# Patient Record
Sex: Male | Born: 1970 | Race: White | Hispanic: No | Marital: Married | Smoking: Never smoker
Health system: Southern US, Community
[De-identification: ages and names within clinical notes are randomized; demographics above are authoritative.]

## PROBLEM LIST (undated history)

## (undated) DIAGNOSIS — I341 Nonrheumatic mitral (valve) prolapse: Secondary | ICD-10-CM

## (undated) DIAGNOSIS — E785 Hyperlipidemia, unspecified: Secondary | ICD-10-CM

## (undated) DIAGNOSIS — N200 Calculus of kidney: Secondary | ICD-10-CM

## (undated) HISTORY — PX: OTHER SURGICAL HISTORY: SHX169

## (undated) HISTORY — DX: Hyperlipidemia, unspecified: E78.5

## (undated) HISTORY — DX: Calculus of kidney: N20.0

## (undated) HISTORY — DX: Nonrheumatic mitral (valve) prolapse: I34.1

## (undated) HISTORY — PX: WISDOM TOOTH EXTRACTION: SHX21

---

## 2002-08-01 ENCOUNTER — Encounter: Payer: Self-pay | Admitting: Internal Medicine

## 2002-08-01 ENCOUNTER — Encounter: Admission: RE | Admit: 2002-08-01 | Discharge: 2002-08-01 | Payer: Self-pay | Admitting: Internal Medicine

## 2005-05-03 ENCOUNTER — Ambulatory Visit: Payer: Self-pay | Admitting: Internal Medicine

## 2006-05-11 ENCOUNTER — Ambulatory Visit: Payer: Self-pay | Admitting: Internal Medicine

## 2008-03-16 ENCOUNTER — Telehealth (INDEPENDENT_AMBULATORY_CARE_PROVIDER_SITE_OTHER): Payer: Self-pay | Admitting: *Deleted

## 2008-03-16 ENCOUNTER — Emergency Department (HOSPITAL_COMMUNITY): Admission: EM | Admit: 2008-03-16 | Discharge: 2008-03-16 | Payer: Self-pay | Admitting: Emergency Medicine

## 2008-08-07 ENCOUNTER — Ambulatory Visit: Payer: Self-pay | Admitting: Internal Medicine

## 2008-08-07 DIAGNOSIS — I059 Rheumatic mitral valve disease, unspecified: Secondary | ICD-10-CM | POA: Insufficient documentation

## 2008-08-21 ENCOUNTER — Encounter (INDEPENDENT_AMBULATORY_CARE_PROVIDER_SITE_OTHER): Payer: Self-pay | Admitting: *Deleted

## 2010-12-11 LAB — CONVERTED CEMR LAB
Albumin: 4.3 g/dL (ref 3.5–5.2)
Alkaline Phosphatase: 29 units/L — ABNORMAL LOW (ref 39–117)
BUN: 16 mg/dL (ref 6–23)
Calcium: 9 mg/dL (ref 8.4–10.5)
Creatinine, Ser: 1.1 mg/dL (ref 0.4–1.5)
Eosinophils Absolute: 0.1 10*3/uL (ref 0.0–0.7)
Eosinophils Relative: 1.8 % (ref 0.0–5.0)
GFR calc Af Amer: 98 mL/min
GFR calc non Af Amer: 81 mL/min
Glucose, Bld: 94 mg/dL (ref 70–99)
HCT: 44.9 % (ref 39.0–52.0)
HDL: 37.7 mg/dL — ABNORMAL LOW (ref >39.0)
Hemoglobin: 16.1 g/dL (ref 13.0–17.0)
MCV: 86.4 fL (ref 78.0–100.0)
Monocytes Absolute: 0.5 10*3/uL (ref 0.1–1.0)
Monocytes Relative: 8.4 % (ref 3.0–12.0)
Neutro Abs: 3.4 10*3/uL (ref 1.4–7.7)
Platelets: 251 10*3/uL (ref 150–400)
Potassium: 4.2 meq/L (ref 3.5–5.1)
RDW: 12 % (ref 11.5–14.6)
TSH: 0.91 microintl units/mL (ref 0.35–5.50)
Total Protein: 7.6 g/dL (ref 6.0–8.3)
Triglycerides: 90 mg/dL (ref 0–149)
WBC: 5.6 10*3/uL (ref 4.5–10.5)

## 2011-04-28 ENCOUNTER — Encounter: Payer: Self-pay | Admitting: Internal Medicine

## 2011-05-04 ENCOUNTER — Encounter: Payer: Self-pay | Admitting: Internal Medicine

## 2011-05-05 ENCOUNTER — Encounter: Payer: Self-pay | Admitting: Internal Medicine

## 2011-05-05 ENCOUNTER — Ambulatory Visit (INDEPENDENT_AMBULATORY_CARE_PROVIDER_SITE_OTHER): Payer: BC Managed Care – PPO | Admitting: Internal Medicine

## 2011-05-05 VITALS — BP 112/68 | HR 64 | Temp 98.4°F | Resp 12 | Ht 74.75 in | Wt 164.0 lb

## 2011-05-05 DIAGNOSIS — R3129 Other microscopic hematuria: Secondary | ICD-10-CM | POA: Insufficient documentation

## 2011-05-05 DIAGNOSIS — I059 Rheumatic mitral valve disease, unspecified: Secondary | ICD-10-CM

## 2011-05-05 DIAGNOSIS — Z0000 Encounter for general adult medical examination without abnormal findings: Secondary | ICD-10-CM

## 2011-05-05 NOTE — Progress Notes (Signed)
Addended by: Legrand Como on: 05/05/2011 12:00 PM   Modules accepted: Orders

## 2011-05-05 NOTE — Progress Notes (Signed)
Subjective:    Patient ID: Michael Sampson E Wetherbee, male    DOB: 01/13/1971, 40 y.o.   MRN: [id number]  HPI  Michael Sampson  is here for a physical; he has no acute issues.      Review of Systems Patient reports no significant    vision/ hearing changes,anorexia, weight change, fever ,adenopathy, persistant / recurrent hoarseness, swallowing issues, chest pain,palpitations, edema,persistant / recurrent cough, hemoptysis, dyspnea(rest, exertional, paroxysmal nocturnal), gastrointestinal  bleeding (melena, rectal bleeding), abdominal pain, excessive heart burn, GU symptoms( dysuria, hematuria, pyuria, voiding  issues) syncope, focal weakness, memory loss,numbness & tingling, skin/hair/nail changes,depression, anxiety, abnormal bruising/bleeding, musculoskeletal symptoms/signs.      Objective:   Physical Exam Gen.: Thin but healthy and well-nourished in appearance. Alert, appropriate and cooperative throughout exam. Head: Normocephalic without obvious abnormalities;  no alopecia . Moustache Eyes: No corneal or conjunctival inflammation noted. Pupils equal round reactive to light and accommodation. Fundal exam is benign without hemorrhages, exudate, papilledema. Extraocular motion intact. Vision grossly normal. Ears: External  ear exam reveals no significant lesions or deformities. Canals clear .TMs normal. Hearing is grossly normal bilaterally. Nose: External nasal exam reveals no deformity or inflammation. Nasal mucosa are pink and moist. No lesions or exudates noted.  Mouth: Oral mucosa and oropharynx reveal no lesions or exudates. Teeth in good repair. Neck: No deformities, masses, or tenderness noted. Range of motion &. Thyroid normal. Lungs: Normal respiratory effort; chest expands symmetrically. Lungs are clear to auscultation without rales, wheezes, or increased work of breathing. Heart: Normal rate and rhythm. Normal S1 and S2. No gallop,  or rub. Classic click @ apex ;  No MR murmur . Abdomen: Bowel  sounds normal; abdomen soft and nontender. No masses, organomegaly or hernias noted. Genitalia/ DRE: L varicocele; prostate ULN                                                                                      Musculoskeletal/extremities: No deformity or scoliosis noted of  the thoracic or lumbar spine. No clubbing, cyanosis, edema, or deformity noted. Range of motion  normal .Tone & strength  normal.Joints normal. Nail health  Good. Mild crepitus of knees Vascular: Carotid, radial artery, dorsalis pedis and dorsalis posterior tibial pulses are full and equal. No bruits present. Neurologic: Alert and oriented x3. Deep tendon reflexes symmetrical and normal.          Skin: Intact without suspicious lesions or rashes.Op scar anterior chest. Lymph: No cervical, axillary, or inguinal lymphadenopathy present. Psych: Mood and affect are normal. Normally interactive                                                                                         Assessment & Plan:  #1 comprehensive physical exam; no acute findings #2 see Problem List with Assessments & Recommendations Plan:  see Orders

## 2011-05-05 NOTE — Patient Instructions (Signed)
Preventive Health Care: Exercise at least 30-45 minutes a day,  3-4 days a week.  Eat a low-fat diet with lots of fruits and vegetables, up to 7-9 servings per day.

## 2011-07-08 ENCOUNTER — Encounter: Payer: Self-pay | Admitting: Internal Medicine

## 2011-07-08 DIAGNOSIS — E785 Hyperlipidemia, unspecified: Secondary | ICD-10-CM | POA: Insufficient documentation

## 2011-07-18 ENCOUNTER — Encounter: Payer: Self-pay | Admitting: Internal Medicine

## 2012-05-10 ENCOUNTER — Ambulatory Visit (INDEPENDENT_AMBULATORY_CARE_PROVIDER_SITE_OTHER): Payer: BC Managed Care – PPO | Admitting: Internal Medicine

## 2012-05-10 ENCOUNTER — Encounter: Payer: Self-pay | Admitting: Internal Medicine

## 2012-05-10 VITALS — BP 122/80 | HR 65 | Temp 98.3°F | Resp 12 | Ht 74.0 in | Wt 175.6 lb

## 2012-05-10 DIAGNOSIS — Z0000 Encounter for general adult medical examination without abnormal findings: Secondary | ICD-10-CM

## 2012-05-10 DIAGNOSIS — E785 Hyperlipidemia, unspecified: Secondary | ICD-10-CM

## 2012-05-10 DIAGNOSIS — I059 Rheumatic mitral valve disease, unspecified: Secondary | ICD-10-CM

## 2012-05-10 LAB — CBC WITH DIFFERENTIAL/PLATELET
Eosinophils Relative: 5.2 % — ABNORMAL HIGH (ref 0.0–5.0)
Lymphocytes Relative: 32.9 % (ref 12.0–46.0)
Lymphs Abs: 2 10*3/uL (ref 0.7–4.0)
MCHC: 33.5 g/dL (ref 30.0–36.0)
Monocytes Absolute: 0.4 10*3/uL (ref 0.1–1.0)
Neutro Abs: 3.3 10*3/uL (ref 1.4–7.7)
Neutrophils Relative %: 54.6 % (ref 43.0–77.0)
RBC: 5.47 Mil/uL (ref 4.22–5.81)
RDW: 13.8 % (ref 11.5–14.6)

## 2012-05-10 LAB — LIPID PANEL
Cholesterol: 184 mg/dL (ref 0–200)
LDL Cholesterol: 120 mg/dL — ABNORMAL HIGH (ref 0–99)
Triglycerides: 80 mg/dL (ref 0.0–149.0)

## 2012-05-10 LAB — T4, FREE: Free T4: 0.95 ng/dL (ref 0.60–1.60)

## 2012-05-10 LAB — HEPATIC FUNCTION PANEL
ALT: 19 U/L (ref 0–53)
AST: 14 U/L (ref 0–37)
Albumin: 4.2 g/dL (ref 3.5–5.2)
Total Protein: 7.4 g/dL (ref 6.0–8.3)

## 2012-05-10 LAB — BASIC METABOLIC PANEL
CO2: 30 mEq/L (ref 19–32)
Calcium: 9.1 mg/dL (ref 8.4–10.5)
Creatinine, Ser: 1.1 mg/dL (ref 0.4–1.5)

## 2012-05-10 NOTE — Progress Notes (Signed)
Subjective:    Patient ID: Michael Sampson, male    DOB: 1971/10/18, 41 y.o.   MRN: [id number]  HPI  [name] is here for a physical;acute issues include intermittent fatigue X 9 months      Review of Systems  Fatigue only  with exertion   Primarily physical fatigue Symptoms: Fever/ chills : no Night sweats:no                                                                                            Vision changes ( blurred/ double/ loss): no                                                                                                Hoarseness or swallowing dysfunction: no                                                                                        Bowel changes( constipation/ diarrhea): no                                                                                     Weight change: no   Exertional chest pain:no  Dyspnea on exertion: no  Cough: dry cough Hemoptysis: no  New medications:no Leg swelling: no Orthopnea: no PND: no  Melena/ rectal bleeding:no Adenopathy: no Severe snoring: no Daytime sleepiness: no Skin / hair / nail changes: no  Temperature intolerance( heat/ cold) :no                                                                                                   Feeling depressed: no  Anhedonia: no Altered appetite: no Poor  sleep/ Apnea :no Abnormal bruising / bleeding or enlarged lymph nodes: no                                                                        PMH/ FH of thyroid disease: sister & mother      Objective:   Physical Exam Gen.: Thin but healthy and well-nourished in appearance. Alert, appropriate and cooperative throughout exam. Head: Normocephalic without obvious abnormalities;  no alopecia . Unshaven; moustache Eyes: No corneal or conjunctival inflammation noted. Pupils equal round reactive to light and accommodation. Fundal exam is benign without hemorrhages, exudate, papilledema. Extraocular motion intact. Vision  grossly normal. Ears: External  ear exam reveals no significant lesions or deformities. Canals clear .TMs normal. Hearing is grossly normal bilaterally. Nose: External nasal exam reveals no deformity or inflammation. Nasal mucosa are pink and moist. No lesions or exudates noted.   Mouth: Oral mucosa and oropharynx reveal no lesions or exudates. Teeth in good repair. Neck: No deformities, masses, or tenderness noted. Range of motion & Thyroid normal  Lungs: Normal respiratory effort; chest expands symmetrically. Lungs are clear to auscultation without rales, wheezes, or increased work of breathing. Heart: Normal rate and rhythm. Normal S1 and S2. No gallop,  or rub. Apical click intermittently w/o murmur. Abdomen: Bowel sounds normal; abdomen soft and nontender. No masses, organomegaly or hernias noted. Genitalia/ DRE: Genitalia normal except for left varices .Prostate is normal without enlargement, asymmetry, nodularity, or induration.  Musculoskeletal/extremities: No deformity or scoliosis noted of  the thoracic or lumbar spine. No clubbing, cyanosis, edema, or deformity noted. Range of motion  normal .Tone & strength  normal.Joints normal. Nail health  good. Vascular: Carotid, radial artery, dorsalis pedis and  posterior tibial pulses are full and equal. No bruits present. Neurologic: Alert and oriented x3. Deep tendon reflexes symmetrical and normal.          Skin: Intact without suspicious lesions or rashes. Lymph: No cervical, axillary, or inguinal lymphadenopathy present. Psych: Mood and affect are normal. Normally interactive                                                                                         Assessment & Plan:  #1 comprehensive physical exam; no acute findings #2 see Problem List with Assessments & Recommendations #3 fatigue Plan: see Orders

## 2012-05-10 NOTE — Patient Instructions (Addendum)
Preventive Health Care: Exercise at least 30-45 minutes a day,  3-4 days a week.  Eat a low-fat diet with lots of fruits and vegetables, up to 7-9 servings per day. Consume less than 40 grams of sugar per day from foods & drinks with High Fructose Corn Sugar as # 1,2,3 or # 4 on label. Health Care Power of Attorney & Living Will. Complete if not in place ; these place you in charge of your health care decisions. Please try to go on My Chart within the next 24 hours to allow me to release the results directly to you.  

## 2012-07-18 ENCOUNTER — Encounter: Payer: Self-pay | Admitting: Internal Medicine

## 2012-07-18 ENCOUNTER — Ambulatory Visit (INDEPENDENT_AMBULATORY_CARE_PROVIDER_SITE_OTHER): Payer: BC Managed Care – PPO | Admitting: Internal Medicine

## 2012-07-18 VITALS — BP 118/82 | HR 69 | Temp 97.6°F | Wt 182.8 lb

## 2012-07-18 DIAGNOSIS — J209 Acute bronchitis, unspecified: Secondary | ICD-10-CM

## 2012-07-18 MED ORDER — AZITHROMYCIN 250 MG PO TABS: ORAL_TABLET | ORAL | Status: AC

## 2012-07-18 NOTE — Progress Notes (Signed)
  Subjective:    Patient ID: Michael Sampson, male    DOB: July 06, 1971, 41 y.o.   MRN: [id number]  HPI He began to have a dry cough 2-3 weeks ago after exposure to dust. The cough has persisted but is intermittent. Has been associated with some shortness of breath but no wheezing. He describes some pressure sensation with the chest symptoms.  He's also had sneezing and watery eyes for which he took Benadryl with some response.    Review of Systems He denies significant or new nasal congestion; nasal purulence; facial pain; anosmia; fatigue; fever; headache; halitosis; earache and dental pain.  He does have some chronic, intermittent left ear discomfort unrelated to the present illness. There is some decrease in obstruction to nasal airflow on the right.  He describes intermittent cramping abdominal discomfort on either lateral side and some loose stools. He denies melena, rectal bleeding, or frank diarrhea. He has no hematuria, dysuria,or pyuria     Objective:   Physical Exam General appearance: thin but in good health ;well nourished; no acute distress or increased work of breathing is present.  No  lymphadenopathy about the head, neck, or axilla noted.   Eyes: No conjunctival inflammation or lid edema is present.   Ears:  External ear exam shows no significant lesions or deformities.  Otoscopic examination reveals clear canals, tympanic membranes are intact bilaterally without bulging, retraction, inflammation or discharge.  Nose:  External nasal examination shows no deformity or inflammation. R nasal mucosa are dry & erythematous without lesions or exudates. R septal  Deviation.Some obstruction to airflow on R.   Oral exam: Dental hygiene is good; lips and gums are healthy appearing.There is no oropharyngeal erythema or exudate noted.     Heart:  Normal rate and regular rhythm. S1 and S2 normal without gallop, murmur, click, rub or other extra sounds.   Lungs:Chest clear to  auscultation; no wheezes, rhonchi,rales ,or rubs present.No increased work of breathing.    Bowel sounds are normal. Abdomen is soft with minimal L suprapubic tenderness; no organomegaly, hernias  or masses.   Extremities:  No cyanosis, edema, or clubbing  noted    Skin: Warm & dry           Assessment & Plan:  #1 acute bronchitis w/o bronchospasm. No URI #2 IBS Plan: See orders and recommendations

## 2012-07-18 NOTE — Patient Instructions (Addendum)
Plain Mucinex for thick secretions ;force NON dairy fluids . Use a Neti pot daily as needed for sinus congestion; going from open side to congested side . Nasal cleansing in the shower as discussed. Make sure that all residual soap is removed to prevent irritation.Nasonex 1 spray in each nostril twice a day as needed. Use the "crossover" technique as discussed. Plain Allegra 160 daily as needed for itchy eyes & sneezing. Please take the probiotic , Align, every day until the bowels are normal. This will replace the normal bacteria which  are necessary for formation of normal stool and processing of food.

## 2012-12-28 ENCOUNTER — Other Ambulatory Visit: Payer: Self-pay

## 2013-01-15 ENCOUNTER — Other Ambulatory Visit: Payer: Self-pay | Admitting: Occupational Medicine

## 2013-01-15 ENCOUNTER — Ambulatory Visit: Payer: Self-pay

## 2013-01-15 DIAGNOSIS — M79672 Pain in left foot: Secondary | ICD-10-CM

## 2013-05-23 ENCOUNTER — Ambulatory Visit (INDEPENDENT_AMBULATORY_CARE_PROVIDER_SITE_OTHER): Payer: BC Managed Care – PPO | Admitting: Internal Medicine

## 2013-05-23 ENCOUNTER — Encounter: Payer: Self-pay | Admitting: Internal Medicine

## 2013-05-23 VITALS — BP 116/70 | HR 66 | Temp 97.7°F | Resp 12 | Ht 74.08 in | Wt 182.0 lb

## 2013-05-23 DIAGNOSIS — Z0000 Encounter for general adult medical examination without abnormal findings: Secondary | ICD-10-CM

## 2013-05-23 LAB — CBC WITH DIFFERENTIAL/PLATELET
Basophils Absolute: 0 10*3/uL (ref 0.0–0.1)
HCT: 44.6 % (ref 39.0–52.0)
MCHC: 33.8 g/dL (ref 30.0–36.0)
MCV: 86.4 fl (ref 78.0–100.0)
Monocytes Relative: 7.1 % (ref 3.0–12.0)
Neutro Abs: 2.9 10*3/uL (ref 1.4–7.7)
Neutrophils Relative %: 54.4 % (ref 43.0–77.0)
Platelets: 240 10*3/uL (ref 150.0–400.0)
WBC: 5.3 10*3/uL (ref 4.5–10.5)

## 2013-05-23 LAB — BASIC METABOLIC PANEL
BUN: 19 mg/dL (ref 6–23)
CO2: 28 mEq/L (ref 19–32)
Chloride: 107 mEq/L (ref 96–112)
Creatinine, Ser: 1 mg/dL (ref 0.4–1.5)
Potassium: 4 mEq/L (ref 3.5–5.1)

## 2013-05-23 LAB — HEPATIC FUNCTION PANEL
AST: 13 U/L (ref 0–37)
Albumin: 4 g/dL (ref 3.5–5.2)
Alkaline Phosphatase: 33 U/L — ABNORMAL LOW (ref 39–117)

## 2013-05-23 LAB — LIPID PANEL
LDL Cholesterol: 109 mg/dL — ABNORMAL HIGH (ref 0–99)
Total CHOL/HDL Ratio: 4
VLDL: 12.2 mg/dL (ref 0.0–40.0)

## 2013-05-23 LAB — TSH: TSH: 1.07 u[IU]/mL (ref 0.35–5.50)

## 2013-05-23 NOTE — Progress Notes (Signed)
Subjective:    Patient ID: Michael Sampson, male    DOB: [birth year], 42 y.o.   MRN: [id number]  HPI  He is here for a physical;acute issues denied     Review of Systems He is on no specific diet; he is physically active on his job without symptoms. Specifically he denies chest pain, palpitations, dyspnea, or claudication. Family history is negative for premature coronary disease. His LDL goal has been mildly elevated.  In April he sustained an injury to the left foot at work. Films were negative. After a day of being physically active on his feet he will have pain in the mid, medial aspect of the left plantar fascia.     Objective:   Physical Exam  Gen.: Healthy and well-nourished in appearance. Alert, appropriate and cooperative throughout exam. Appears younger than stated age  Head: Normocephalic without obvious abnormalities; moustache Eyes: No corneal or conjunctival inflammation noted. Pupils equal round reactive to light and accommodation. Slight ptosis OS . Extraocular motion intact. Vision grossly normal without lenses Ears: External  ear exam reveals no significant lesions or deformities. Canals clear .TMs normal. Hearing is grossly normal bilaterally. Nose: External nasal exam reveals no deformity or inflammation. Nasal mucosa are pink and moist. No lesions or exudates noted.  Mouth: Oral mucosa and oropharynx reveal no lesions or exudates. Teeth in good repair. Neck: No deformities, masses, or tenderness noted. Range of motion & Thyroid normal. Lungs: Normal respiratory effort; chest expands symmetrically. Lungs are clear to auscultation without rales, wheezes, or increased work of breathing. Heart: Normal rate and rhythm. Normal S1 and S2. No gallop, click, or rub. S4 ; no murmur. Abdomen: Bowel sounds normal; abdomen soft and nontender. No masses, organomegaly or hernias noted. Genitalia: Genitalia normal except for left varices. Prostate is upper limits normal in size w/o   asymmetry, nodularity, or induration.                           Musculoskeletal/extremities: No deformity or scoliosis noted of  the thoracic or lumbar spine.  No clubbing, cyanosis, edema, or significant extremity  deformity noted. Range of motion normal .Tone & strength  Normal. Joints normal. Nail health good. Able to lie down & sit up w/o help. Negative SLR bilaterally. There is no tenderness to palpation/percussion of the plantar fascia of the left foot. There's slight discomfort with compression of the foot medially to laterally Vascular: Carotid, radial artery, dorsalis pedis and  posterior tibial pulses are full and equal. No bruits present. Neurologic: Alert and oriented x3. Deep tendon reflexes symmetrical and normal.  Gait normal  including heel & toe walking .        Skin: Intact without suspicious lesions or rashes. Lymph: No cervical, axillary, or inguinal lymphadenopathy present. Psych: Mood and affect are normal. Normally interactive                                                                                       Assessment & Plan:  #1 comprehensive physical exam; no acute findings  #2 skeletal etiology of the foot pain suggested. Interventions recommended. If symptoms  persist; podiatry referral encouraged  Plan: see Orders  & Recommendations

## 2013-05-23 NOTE — Patient Instructions (Addendum)
Use an anti-inflammatory cream such as Aspercreme or Zostrix cream twice a day to the left foot as needed. In lieu of this warm moist compresses or  hot water bottle can be used. Do not apply ice . Roll the affected foot over a tennis ball 20 times twice a day. Soaking the foot in warm Epsom salts for 15-20 minutes is option as well. Wear arch supports in both shoes. Podiatry referral if symptoms persist.  Consider glucosamine sulfate 1500 mg daily for the foot for 6-8 weeks ;this will rehydrate the soft tissues.

## 2013-08-22 ENCOUNTER — Encounter: Payer: Self-pay | Admitting: Internal Medicine

## 2013-08-22 ENCOUNTER — Ambulatory Visit (INDEPENDENT_AMBULATORY_CARE_PROVIDER_SITE_OTHER): Payer: BC Managed Care – PPO | Admitting: Internal Medicine

## 2013-08-22 ENCOUNTER — Ambulatory Visit (INDEPENDENT_AMBULATORY_CARE_PROVIDER_SITE_OTHER)
Admission: RE | Admit: 2013-08-22 | Discharge: 2013-08-22 | Disposition: A | Discharge status: Home / Self Care | Payer: BC Managed Care – PPO | Source: Ambulatory Visit | Attending: Internal Medicine | Admitting: Internal Medicine

## 2013-08-22 VITALS — BP 120/80 | HR 82 | Temp 98.6°F | Wt 184.0 lb

## 2013-08-22 DIAGNOSIS — R0609 Other forms of dyspnea: Secondary | ICD-10-CM

## 2013-08-22 DIAGNOSIS — M79609 Pain in unspecified limb: Secondary | ICD-10-CM

## 2013-08-22 DIAGNOSIS — R0600 Dyspnea, unspecified: Secondary | ICD-10-CM

## 2013-08-22 DIAGNOSIS — R0789 Other chest pain: Secondary | ICD-10-CM

## 2013-08-22 DIAGNOSIS — R300 Dysuria: Secondary | ICD-10-CM

## 2013-08-22 LAB — TROPONIN I: Troponin I: <0.01 ng/mL (ref <0.06)

## 2013-08-22 NOTE — Progress Notes (Signed)
  Subjective:    Patient ID: Michael Sampson, male    DOB: [birth year], 41 y.o.   MRN: [id number]  HPI He has 2 separate issues, chest pain and shortness of breath  Shortness of breath began approximately one year ago after being exposed to excessive dust exposure in the attic of his in-laws. Since then he will have intermittent shortness of breath with secondhand smoke exposure or with exertion such as playing with his children.  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  Significant past medical history includes pectus excavatum surgery as a child.  He has never smoked but has been exposed to secondhand smoke growing up as both parents were multiple today smoker's.  He's had intermittent sharp right-sided chest pain which is nonexertional and non radiating intermittently lasting minutes over past year also. Last episode 08/21/13 He is concerned as there is some remote history of cardiac disease and stroke in his family.  He has had intermittent dull left medial thigh discomfort as well.  The shortness of breath and chest pain are not necessarily associated with each other but can occur simultaneously.    Review of Systems He is on a heart healthy diet; he has an active job involving walking & plays with his 40 year old > 5  times per week .He denies  palpitations, edma, PNDysnea or claudication.  Family history is negative for premature coronary disease. No statin to date.  Abdominal pain, unexplained weight loss, melena, rectal bleeding. The shortness of breath is not associated with cough or sputum production. He's had no hemoptysis. There is no radicular component to the chest pain. He has not noticed any no rash or color or temperature change area of the pain .  He  also has had some left foot pain. Films in March were negative.  He's also had some perineal discomfort. He has mild dysuria without pyuria or hematuria    Objective:   Physical Exam  Appears thin but healthy  and adequately nourished & in no acute distress  No carotid bruits are present.No neck pain distention present at 10 - 15 degrees. Thyroid normal to palpation  Heart rhythm and rate are normal with no significant murmurs or gallops. ? Click @ apex sitting  Chest is clear with no increased work of breathing.  No costochondral tenderness. Well-healed pectus excavatum surgery scar  There is no evidence of aortic aneurysm or renal artery bruits  Abdomen soft with no organomegaly or masses. No HJR.  Genital exams unremarkable with no hernia, adenopathy, or masses.  Some tenderness to compression of the left calf. Some tenderness to palpation left medial inferior thigh  No clubbing, cyanosis or edema present.  Pedal pulses are intact   No ischemic skin changes are present . Nails healthy with chronic fungal changes   Alert and oriented. Strength, tone, DTRs reflexes normal          Assessment & Plan:  #1 dyspnea in the context of dust exposure and welding exposures. Pulmonary function tests indicated  #2 atypical right-sided chest pain. EKG reveals no ischemic changes. There is poor R wave progression. He has had pectus excavatum surgery which may impact EKG. He may have a mitral click  #3 left medial thigh discomfort. D-dimer will be checked  #4 perineal discomfort; negative GU exam  #5 foot pain; referral to sports medicine is indicated once the cardiopulmonary issues have been evaluated.

## 2013-08-22 NOTE — Patient Instructions (Signed)
Order for x-rays entered into  the computer; these will be performed at [address] across from [facility]. No appointment is necessary. 

## 2013-09-04 ENCOUNTER — Telehealth: Payer: Self-pay | Admitting: Internal Medicine

## 2013-09-04 DIAGNOSIS — M25579 Pain in unspecified ankle and joints of unspecified foot: Secondary | ICD-10-CM

## 2013-09-04 NOTE — Telephone Encounter (Signed)
Patient states that he is still having foot pain since last visit. He would like referral to orthopaedic.

## 2013-09-04 NOTE — Telephone Encounter (Signed)
Noted and referral placed

## 2013-09-04 NOTE — Telephone Encounter (Signed)
Please advise if ok for referral 

## 2013-09-04 NOTE — Telephone Encounter (Signed)
I recommend Dr Terrilee Files, Sports Medicine. He is incredibly skilled. I am on road ; please send time essential messages to others docs . Thanks, [employer]

## 2013-09-04 NOTE — Addendum Note (Signed)
Addended by: Jackson Latino on: 09/04/2013 04:13 PM   Modules accepted: Orders

## 2013-09-12 ENCOUNTER — Other Ambulatory Visit (INDEPENDENT_AMBULATORY_CARE_PROVIDER_SITE_OTHER): Payer: BC Managed Care – PPO

## 2013-09-12 ENCOUNTER — Encounter: Payer: Self-pay | Admitting: Family Medicine

## 2013-09-12 ENCOUNTER — Ambulatory Visit (INDEPENDENT_AMBULATORY_CARE_PROVIDER_SITE_OTHER): Payer: BC Managed Care – PPO | Admitting: Internal Medicine

## 2013-09-12 ENCOUNTER — Ambulatory Visit (INDEPENDENT_AMBULATORY_CARE_PROVIDER_SITE_OTHER): Payer: BC Managed Care – PPO | Admitting: Family Medicine

## 2013-09-12 VITALS — BP 114/72 | HR 69 | Ht 73.0 in | Wt 182.0 lb

## 2013-09-12 DIAGNOSIS — M76829 Posterior tibial tendinitis, unspecified leg: Secondary | ICD-10-CM

## 2013-09-12 DIAGNOSIS — M79609 Pain in unspecified limb: Secondary | ICD-10-CM

## 2013-09-12 DIAGNOSIS — M76822 Posterior tibial tendinitis, left leg: Secondary | ICD-10-CM | POA: Insufficient documentation

## 2013-09-12 DIAGNOSIS — M722 Plantar fascial fibromatosis: Secondary | ICD-10-CM

## 2013-09-12 DIAGNOSIS — M79672 Pain in left foot: Secondary | ICD-10-CM

## 2013-09-12 DIAGNOSIS — R0602 Shortness of breath: Secondary | ICD-10-CM

## 2013-09-12 LAB — PULMONARY FUNCTION TEST

## 2013-09-12 MED ORDER — MELOXICAM 15 MG PO TABS: 15.0000 mg | ORAL_TABLET | Freq: Every day | ORAL | Status: DC

## 2013-09-12 NOTE — Assessment & Plan Note (Signed)
Plantar Fascitis: We reviewed that stretching is critically important to the treatment of PF. Reviewed footwear. Rigid soles have been shown to help with PF. Night splints can help. Reviewed rehab of stretching and calf raises.  Could benefit from a corticosteroid injection, orthotics, or other measures if conservative treatment fails. Meloxicam daily x 10 days RTC in 3-4 weeks.

## 2013-09-12 NOTE — Patient Instructions (Signed)
Very nice to meet you You have plantar fascitis and posterior tibialis tendonitis.   Please read handouts on Plantar Fascitis.  STRETCHING and Strengthening program critically important.  Strengthening on foot and calf muscles as seen in handout. Calf raises, 2 legged, drop heels, raise on to toes hold 2 seconds, come down for count of 4.  30 reps daily for first week, then 2 sets daily for 1 week, then 3 sets daily thereafter.   Foot massage with tennis ball. Ice baths 20 minutes 2 times daily Meloxicam daily for 10 days then as needed.   Towel Scrunches: get a towel or hand towel, use toes to pick up and scrunch up the towel.  Marble pick-ups, practice picking up marbles with toes and placing into a cup  NEEDS TO BE DONE EVERY DAY  Recommended over the counter insoles. ([employer] at [employer].   A rigid shoe with good arch support helps: Dansko (great), Lelon Frohlich No easily bendable shoes.   Tuli's heel cups  Come back in 3-4 weeks.

## 2013-09-12 NOTE — Progress Notes (Signed)
PFT done today. 

## 2013-09-12 NOTE — Progress Notes (Signed)
I'm seeing this patient by the request  of:  Marga Melnick, MD  CC: Left foot pain  HPI: Patient is a very pleasant 42 year old gentleman coming in with left foot pain. Patient states that he has had this for approximately 7-8 months. Patient is a murmur having an injury at work where a very heavy object fell on the inside of his foot. Patient did have a Worker's Comp. case and it seemed to improve somewhat. Patient unfortunately states that this pain seemed to be continuing and now getting worse. Patient states mostly it seems to be at the heel and radiates on the inferior aspect of his foot. Patient states it is worse in the morning with the first steps as well as after sitting a long period of time. Patient has tried doing some massage and stretches without any significant improvement. Patient describes the pain as a sharp sensation with a dull aching pain for hours afterwards. Patient denies any numbness or tingling or any swelling. Patient has a severity is 7/10. Patient does work on his feet for usually 8-10 hour shifts.  Past medical, surgical, family and social history reviewed. Medications reviewed all in the electronic medical record.   Review of Systems: No headache, visual changes, nausea, vomiting, diarrhea, constipation, dizziness, abdominal pain, skin rash, fevers, chills, night sweats, weight loss, swollen lymph nodes, body aches, joint swelling, muscle aches, chest pain, shortness of breath, mood changes.   Objective:    Blood pressure 114/72, pulse 69, height 6\' 1"  (1.854 m), weight 182 lb (82.555 kg), SpO2 97.00%.   General: No apparent distress alert and oriented x3 mood and affect normal, dressed appropriately.  HEENT: Pupils equal, extraocular movements intact Respiratory: Patient's speak in full sentences and does not appear short of breath Cardiovascular: No lower extremity edema, non tender, no erythema Skin: Warm dry intact with no signs of infection or rash on  extremities or on axial skeleton. Abdomen: Soft nontender Neuro: Cranial nerves II through XII are intact, neurovascularly intact in all extremities with 2+ DTRs and 2+ pulses. Lymph: No lymphadenopathy of posterior or anterior cervical chain or axillae bilaterally.  Gait normal with good balance and coordination.  MSK: Non tender with full range of motion and good stability and symmetric strength and tone of shoulders, elbows, wrist, hip, knee and ankles bilaterally.  Left foot exam Normal inspection with no visable or palpable fat pad atrophy and no visible swelling/erythema. Patient is tender at medial insertion of plantar fascia into calcaneus. Great toe motion: Full Arch shape: Mild pes planus Other foot breakdown: Unremarkable Contralateral side is very similar with minimal tenderness to palpation over the medial calcaneus area.  MSK US performed of: left ankle This study was ordered, performed, and interpreted by Terrilee Files D.O.  Foot/Ankle:   All structures visualized.   Talar dome unremarkable  Ankle mortise without effusion. Peroneus longus and brevis tendons unremarkable on long and transverse views without sheath effusions. Posterior tibialis does have mild hypoechoic changes surrounding the area. No true tear appreciated.. Flexor hallucis longus, and flexor digitorum longus tendons unremarkable on long and transverse views without sheath effusions. Achilles tendon visualized along length of tendon and unremarkable on long and transverse views without sheath effusion. Anterior Talofibular Ligament and Calcaneofibular Ligaments unremarkable and intact. Deltoid Ligament unremarkable and intact. Plantar fascia intact but does have surrounding hypoechoic changes consistent with effusion. Measure 0.89 cm compared to 0.7 contralateral side. Power doppler signal normal.  IMPRESSION:  Plantar fasciitis left foot, posterior tibialis tendinitis  Impression and Recommendations:      This case required medical decision making of moderate complexity.

## 2013-09-12 NOTE — Assessment & Plan Note (Signed)
  Posterior Tibialis Tendinosis  Using an anatomical model, I reviewed with the patient the structures involved and how they related to their diagnosis. The patient indicated that they understood our discussion and the anatomy involved.   Ice massage 2-3 times a day Rehab as described in p/i, toe raises, pidgeon toed and walking pidgeon toed  The patient would benefit from an ASO ankle brace type to unload the PT tendon if not better RTC in 3-4 weeks.

## 2013-09-18 ENCOUNTER — Encounter: Payer: Self-pay | Admitting: Physician Assistant

## 2013-09-18 ENCOUNTER — Other Ambulatory Visit: Payer: Self-pay

## 2013-09-21 ENCOUNTER — Encounter: Payer: Self-pay | Admitting: Internal Medicine

## 2013-09-21 DIAGNOSIS — R0600 Dyspnea, unspecified: Secondary | ICD-10-CM | POA: Insufficient documentation

## 2013-10-01 ENCOUNTER — Ambulatory Visit (INDEPENDENT_AMBULATORY_CARE_PROVIDER_SITE_OTHER): Payer: BC Managed Care – PPO | Admitting: Physician Assistant

## 2013-10-01 DIAGNOSIS — R0789 Other chest pain: Secondary | ICD-10-CM

## 2013-10-01 DIAGNOSIS — R0609 Other forms of dyspnea: Secondary | ICD-10-CM

## 2013-10-01 NOTE — Progress Notes (Signed)
Exercise Treadmill Test  [place] Michael Sampson is a 42 y.o. male referred by PCP for ETT to evaluate exertional chest pain and dyspnea. No hx of CAD, HTN, HL, DM.  Non-smoker.  Father with CAD dx in 86s.  Exam unremarkable.  ECG:  NSR, HR 74, no ST changes.  Pre-Exercise Testing Evaluation Rhythm: normal sinus  Rate: 74 bpm     Test  Exercise Tolerance Test Ordering MD: Lewayne Bunting, MD  Interpreting MD: Tereso Newcomer, PA-C  Unique Test No: 1  Treadmill:  1  Indication for ETT: chest pain - rule out ischemia  Contraindication to ETT: No   Stress Modality: exercise - treadmill  Cardiac Imaging Performed: non   Protocol: standard Bruce - maximal  Max BP:  169/85  Max MPHR (bpm):  180 85% MPR (bpm):  153  MPHR obtained (bpm):  179 % MPHR obtained:  99  Reached 85% MPHR (min:sec):  9:00 Total Exercise Time (min-sec):  12:00  Workload in METS:  13.4 Borg Scale: 13  Reason ETT Terminated:  desired heart rate attained    ST Segment Analysis At Rest: normal ST segments - no evidence of significant ST depression With Exercise: non-specific ST changes  Other Information Arrhythmia:  No Angina during ETT:  absent (0) Quality of ETT:  diagnostic  ETT Interpretation:  normal - no evidence of ischemia by ST analysis  Comments: Excellent exercise capacity. No chest pain. Normal BP response to exercise. No ST-T changes to suggest ischemia.   Recommendations: F/u with Marga Melnick, MD as directed. Signed,  Tereso Newcomer, PA-C   10/01/2013 10:04 AM

## 2013-10-02 ENCOUNTER — Encounter: Payer: Self-pay | Admitting: Internal Medicine

## 2013-10-17 ENCOUNTER — Ambulatory Visit: Payer: Self-pay | Admitting: Family Medicine

## 2013-11-21 ENCOUNTER — Ambulatory Visit: Payer: BC Managed Care – PPO | Admitting: Family Medicine

## 2013-12-12 ENCOUNTER — Ambulatory Visit: Payer: BC Managed Care – PPO | Admitting: Family Medicine

## 2014-01-09 ENCOUNTER — Ambulatory Visit: Payer: BC Managed Care – PPO | Admitting: Family Medicine

## 2014-01-30 ENCOUNTER — Ambulatory Visit: Payer: BC Managed Care – PPO | Admitting: Family Medicine

## 2014-02-23 ENCOUNTER — Encounter: Payer: Self-pay | Admitting: Family Medicine

## 2014-02-23 ENCOUNTER — Ambulatory Visit (INDEPENDENT_AMBULATORY_CARE_PROVIDER_SITE_OTHER): Payer: BC Managed Care – PPO | Admitting: Family Medicine

## 2014-02-23 VITALS — BP 126/80 | HR 66 | Wt 179.0 lb

## 2014-02-23 DIAGNOSIS — M722 Plantar fascial fibromatosis: Secondary | ICD-10-CM

## 2014-02-23 DIAGNOSIS — M76822 Posterior tibial tendinitis, left leg: Secondary | ICD-10-CM

## 2014-02-23 DIAGNOSIS — M76829 Posterior tibial tendinitis, unspecified leg: Secondary | ICD-10-CM

## 2014-02-23 MED ORDER — MELOXICAM 15 MG PO TABS: 15.0000 mg | ORAL_TABLET | Freq: Every day | ORAL | Status: DC

## 2014-02-23 NOTE — Assessment & Plan Note (Signed)
Patient is doing remarkably well. Discuss the patient to continue exercises 2 times a week as well as icing. Patient was given a refill of meloxicam to use on an as-needed basis. Patient can followup again in 6 weeks if not completely resolved. At that time if he continues to have pain I would like to do an ultrasound and we'll consider further imaging is necessary or further intervention such as nitroglycerin or steroid injection.  Showed patient's function proper techniques and discussed proper shoe wear. Spent greater than 25 minutes with patient face-to-face and had greater than 50% of counseling including as described above in assessment and plan.

## 2014-02-23 NOTE — Progress Notes (Signed)
CC: Left foot pain follow up   HPI: Patient is a 43 year old gentleman coming in for follow up of left foot pain. Patient was found to have some mild plantar fasciitis as well as posterior tibialis 6 months ago. Patient states he has been doing very well and is approximately 90% better. Patient states at the end of the long day or sitting for a long amount of time he still has some mild discomfort at the arch of his foot but overall not stopping him from any activities. Patient has been running more often than notices he's been doing well. Patient knows he needs to get new shoes which would be beneficial he states. Denies any nighttime awakening denies any fevers or chills or any other new symptoms. Past medical, surgical, family and social history reviewed. Medications reviewed all in the electronic medical record.   Review of Systems: No headache, visual changes, nausea, vomiting, diarrhea, constipation, dizziness, abdominal pain, skin rash, fevers, chills, night sweats, weight loss, swollen lymph nodes, body aches, joint swelling, muscle aches, chest pain, shortness of breath, mood changes.   Objective:    Blood pressure 126/80, pulse 66, weight 179 lb (81.194 kg), SpO2 98.00%.   General: No apparent distress alert and oriented x3 mood and affect normal, dressed appropriately.  HEENT: Pupils equal, extraocular movements intact Respiratory: Patient's speak in full sentences and does not appear short of breath Cardiovascular: No lower extremity edema, non tender, no erythema Skin: Warm dry intact with no signs of infection or rash on extremities or on axial skeleton. Abdomen: Soft nontender Neuro: Cranial nerves II through XII are intact, neurovascularly intact in all extremities with 2+ DTRs and 2+ pulses. Lymph: No lymphadenopathy of posterior or anterior cervical chain or axillae bilaterally.  Gait normal with good balance and coordination.  MSK: Non tender with full range of motion and good  stability and symmetric strength and tone of shoulders, elbows, wrist, hip, knee and ankles bilaterally.  Left foot exam Normal inspection with no visable or palpable fat pad atrophy and no visible swelling/erythema. Patient is actually nontender on exam today. Great toe motion: Full Arch shape: Mild pes planus Other foot breakdown: Unremarkable Contralateral foot unremarkable      Impression and Recommendations:     This case required medical decision making of moderate complexity.

## 2014-02-23 NOTE — Patient Instructions (Addendum)
Good to see you Ice bath still at end of the day 20 minutes.  Refilled the meloxicam  Exercisers at least 2 times a week  Stretching whenever sitting.  [employer] orth[employer] at [employer] or online.  Come back again if not perfect or worse in 6 weeks.

## 2014-02-23 NOTE — Assessment & Plan Note (Signed)
Resolved

## 2014-07-21 ENCOUNTER — Emergency Department (HOSPITAL_COMMUNITY): Payer: BC Managed Care – PPO

## 2014-07-21 ENCOUNTER — Emergency Department (HOSPITAL_COMMUNITY)
Admission: EM | Admit: 2014-07-21 | Discharge: 2014-07-21 | Disposition: A | Discharge status: Home / Self Care | Payer: BC Managed Care – PPO | Attending: Emergency Medicine | Admitting: Emergency Medicine

## 2014-07-21 ENCOUNTER — Encounter (HOSPITAL_COMMUNITY): Payer: Self-pay | Admitting: Emergency Medicine

## 2014-07-21 DIAGNOSIS — Z79899 Other long term (current) drug therapy: Secondary | ICD-10-CM | POA: Diagnosis not present

## 2014-07-21 DIAGNOSIS — Z862 Personal history of diseases of the blood and blood-forming organs and certain disorders involving the immune mechanism: Secondary | ICD-10-CM | POA: Diagnosis not present

## 2014-07-21 DIAGNOSIS — N201 Calculus of ureter: Secondary | ICD-10-CM | POA: Insufficient documentation

## 2014-07-21 DIAGNOSIS — R1031 Right lower quadrant pain: Secondary | ICD-10-CM | POA: Diagnosis present

## 2014-07-21 DIAGNOSIS — Z8639 Personal history of other endocrine, nutritional and metabolic disease: Secondary | ICD-10-CM | POA: Insufficient documentation

## 2014-07-21 DIAGNOSIS — Z8679 Personal history of other diseases of the circulatory system: Secondary | ICD-10-CM | POA: Insufficient documentation

## 2014-07-21 LAB — CBC WITH DIFFERENTIAL/PLATELET
BASOS ABS: 0 10*3/uL (ref 0.0–0.1)
Basophils Relative: 0 % (ref 0–1)
EOS PCT: 0 % (ref 0–5)
Eosinophils Absolute: 0 10*3/uL (ref 0.0–0.7)
HCT: 44.6 % (ref 39.0–52.0)
Hemoglobin: 15.1 g/dL (ref 13.0–17.0)
LYMPHS PCT: 7 % — AB (ref 12–46)
Lymphs Abs: 0.8 10*3/uL (ref 0.7–4.0)
MCH: 28.4 pg (ref 26.0–34.0)
MCHC: 33.9 g/dL (ref 30.0–36.0)
MCV: 83.8 fL (ref 78.0–100.0)
Monocytes Absolute: 0.5 10*3/uL (ref 0.1–1.0)
Monocytes Relative: 4 % (ref 3–12)
NEUTROS ABS: 10.2 10*3/uL — AB (ref 1.7–7.7)
Neutrophils Relative %: 89 % — ABNORMAL HIGH (ref 43–77)
PLATELETS: 255 10*3/uL (ref 150–400)
RBC: 5.32 MIL/uL (ref 4.22–5.81)
RDW: 13.2 % (ref 11.5–15.5)
WBC: 11.5 10*3/uL — AB (ref 4.0–10.5)

## 2014-07-21 LAB — URINALYSIS, ROUTINE W REFLEX MICROSCOPIC
Bilirubin Urine: NEGATIVE
GLUCOSE, UA: NEGATIVE mg/dL
Hgb urine dipstick: NEGATIVE
KETONES UR: NEGATIVE mg/dL
Leukocytes, UA: NEGATIVE
Nitrite: NEGATIVE
Protein, ur: NEGATIVE mg/dL
Specific Gravity, Urine: 1.042 — ABNORMAL HIGH (ref 1.005–1.030)
Urobilinogen, UA: 0.2 mg/dL (ref 0.0–1.0)
pH: 6 (ref 5.0–8.0)

## 2014-07-21 LAB — COMPREHENSIVE METABOLIC PANEL
ALK PHOS: 41 U/L (ref 39–117)
ALT: 14 U/L (ref 0–53)
AST: 12 U/L (ref 0–37)
Albumin: 4.5 g/dL (ref 3.5–5.2)
Anion gap: 11 (ref 5–15)
BUN: 16 mg/dL (ref 6–23)
CO2: 28 meq/L (ref 19–32)
Calcium: 9.5 mg/dL (ref 8.4–10.5)
Chloride: 101 mEq/L (ref 96–112)
Creatinine, Ser: 1.31 mg/dL (ref 0.50–1.35)
GFR calc non Af Amer: 66 mL/min — ABNORMAL LOW (ref >90)
GFR, EST AFRICAN AMERICAN: 77 mL/min — AB (ref >90)
GLUCOSE: 121 mg/dL — AB (ref 70–99)
POTASSIUM: 4.2 meq/L (ref 3.7–5.3)
SODIUM: 140 meq/L (ref 137–147)
Total Bilirubin: 0.8 mg/dL (ref 0.3–1.2)
Total Protein: 7.7 g/dL (ref 6.0–8.3)

## 2014-07-21 LAB — LIPASE, BLOOD: Lipase: 22 U/L (ref 11–59)

## 2014-07-21 MED ORDER — IOHEXOL 300 MG/ML  SOLN
50.0000 mL | Freq: Once | INTRAMUSCULAR | Status: AC | PRN
  Administered 2014-07-21: 50 mL via ORAL

## 2014-07-21 MED ORDER — MORPHINE SULFATE 4 MG/ML IJ SOLN
4.0000 mg | Freq: Once | INTRAMUSCULAR | Status: AC
  Administered 2014-07-21: 4 mg via INTRAVENOUS
  Filled 2014-07-21: qty 1

## 2014-07-21 MED ORDER — HYDROMORPHONE HCL PF 1 MG/ML IJ SOLN
0.5000 mg | Freq: Once | INTRAMUSCULAR | Status: AC
  Administered 2014-07-21: 0.5 mg via INTRAVENOUS
  Filled 2014-07-21: qty 1

## 2014-07-21 MED ORDER — IOHEXOL 300 MG/ML  SOLN
100.0000 mL | Freq: Once | INTRAMUSCULAR | Status: AC | PRN
  Administered 2014-07-21: 100 mL via INTRAVENOUS

## 2014-07-21 MED ORDER — SODIUM CHLORIDE 0.9 % IV SOLN
1000.0000 mL | Freq: Once | INTRAVENOUS | Status: AC
  Administered 2014-07-21: 1000 mL via INTRAVENOUS

## 2014-07-21 MED ORDER — OXYCODONE-ACETAMINOPHEN 5-325 MG PO TABS: 1.0000 | ORAL_TABLET | ORAL | Status: DC | PRN

## 2014-07-21 MED ORDER — ONDANSETRON HCL 4 MG/2ML IJ SOLN
4.0000 mg | Freq: Once | INTRAMUSCULAR | Status: AC
  Administered 2014-07-21: 4 mg via INTRAVENOUS
  Filled 2014-07-21: qty 2

## 2014-07-21 MED ORDER — ONDANSETRON HCL 4 MG PO TABS: 4.0000 mg | ORAL_TABLET | Freq: Three times a day (TID) | ORAL | Status: DC | PRN

## 2014-07-21 MED ORDER — SODIUM CHLORIDE 0.9 % IV SOLN
1000.0000 mL | INTRAVENOUS | Status: DC
  Administered 2014-07-21: 1000 mL via INTRAVENOUS

## 2014-07-21 MED ORDER — TAMSULOSIN HCL 0.4 MG PO CAPS: ORAL_CAPSULE | ORAL | Status: DC

## 2014-07-21 NOTE — ED Notes (Signed)
Pt c/o RLQ pain that started during the night.  Pt has started vomited this morning while at work.  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

## 2014-07-21 NOTE — Discharge Instructions (Signed)
Drink plenty of fluids. Drink gatorade when outside working in the heat or when you are sweating a lot. Take the medications as prescribed. Return to the ED if you get a fever, have uncontrolled vomiting or pain. Call Dr Arlyn Leak office to be rechecked if you haven't passed the stone in the next week.     Kidney Stones Kidney stones (urolithiasis) are deposits that form inside your kidneys. The intense pain is caused by the stone moving through the urinary tract. When the stone moves, the ureter goes into spasm around the stone. The stone is usually passed in the urine.  CAUSES   A disorder that makes certain neck glands produce too much parathyroid hormone (primary hyperparathyroidism).  A buildup of uric acid crystals, similar to gout in your joints.  Narrowing (stricture) of the ureter.  A kidney obstruction present at birth (congenital obstruction).  Previous surgery on the kidney or ureters.  Numerous kidney infections. SYMPTOMS   Feeling sick to your stomach (nauseous).  Throwing up (vomiting).  Blood in the urine (hematuria).  Pain that usually spreads (radiates) to the groin.  Frequency or urgency of urination. DIAGNOSIS   Taking a history and physical exam.  Blood or urine tests.  CT scan.  Occasionally, an examination of the inside of the urinary bladder (cystoscopy) is performed. TREATMENT   Observation.  Increasing your fluid intake.  Extracorporeal shock wave lithotripsy--This is a noninvasive procedure that uses shock waves to break up kidney stones.  Surgery may be needed if you have severe pain or persistent obstruction. There are various surgical procedures. Most of the procedures are performed with the use of small instruments. Only small incisions are needed to accommodate these instruments, so recovery time is minimized. The size, location, and chemical composition are all important variables that will determine the proper choice of action for  you. Talk to your health care provider to better understand your situation so that you will minimize the risk of injury to yourself and your kidney.  HOME CARE INSTRUCTIONS   Drink enough water and fluids to keep your urine clear or pale yellow. This will help you to pass the stone or stone fragments.  Strain all urine through the provided strainer. Keep all particulate matter and stones for your health care provider to see. The stone causing the pain may be as small as a grain of salt. It is very important to use the strainer each and every time you pass your urine. The collection of your stone will allow your health care provider to analyze it and verify that a stone has actually passed. The stone analysis will often identify what you can do to reduce the incidence of recurrences.  Only take over-the-counter or prescription medicines for pain, discomfort, or fever as directed by your health care provider.  Make a follow-up appointment with your health care provider as directed.  Get follow-up X-rays if required. The absence of pain does not always mean that the stone has passed. It may have only stopped moving. If the urine remains completely obstructed, it can cause loss of kidney function or even complete destruction of the kidney. It is your responsibility to make sure X-rays and follow-ups are completed. Ultrasounds of the kidney can show blockages and the status of the kidney. Ultrasounds are not associated with any radiation and can be performed easily in a matter of minutes. SEEK MEDICAL CARE IF:  You experience pain that is progressive and unresponsive to any pain medicine you have  been prescribed. SEEK IMMEDIATE MEDICAL CARE IF:   Pain cannot be controlled with the prescribed medicine.  You have a fever or shaking chills.  The severity or intensity of pain increases over 18 hours and is not relieved by pain medicine.  You develop a new onset of abdominal pain.  You feel faint or  pass out.  You are unable to urinate. MAKE SURE YOU:   Understand these instructions.  Will watch your condition.  Will get help right away if you are not doing well or get worse. Document Released: 10/30/2005 Document Revised: 07/02/2013 Document Reviewed: 04/02/2013 [facility] Patient Information 2015 [place], [place]. This information is not intended to replace advice given to you by your health care provider. Make sure you discuss any questions you have with your health care provider.

## 2014-07-21 NOTE — ED Notes (Signed)
Pt transported to CT ?

## 2014-07-21 NOTE — ED Notes (Signed)
MD at bedside. 

## 2014-07-21 NOTE — ED Provider Notes (Signed)
CSN: [id number]     Arrival date & time 07/21/14  1055 History   First MD Initiated Contact with Patient 07/21/14 1136     Chief Complaint  Patient presents with  . Abdominal Pain  . Emesis     (Consider location/radiation/quality/duration/timing/severity/associated sxs/prior Treatment) HPI Patient reports he had a lot of gas and a feeling he needed to have a bowel movement during the night. He reports this morning after going to work about 8:30 he started having right-sided abdominal pain that has gradually gotten worse. He has had nausea with vomiting about 6-8 times without bleeding. He denies diarrhea. He is unsure of fever. He states walking and movement makes the pain worse, he states lying in the fetal position makes it feel better. The pain does not radiate. He states the pain is an aching and yet sharp pain and feels like "getting punched in the side". He states he had something similar in 2009 and was told he had a inflamed appendix then he was admitted however he did not have surgery.  PCP Dr Linna Darner  Past Medical History  Diagnosis Date  . Mitral valve prolapse     no SBE prophylaxis  . Hyperlipidemia    Past Surgical History  Procedure Laterality Date  . Pectus carinatum      surgical correction at age 80  . Wisdom tooth extraction     Family History  Problem Relation Age of Onset  . Hyperlipidemia Father   . COPD Father   . Graves' disease Sister   . Heart murmur Mother   . Hyperthyroidism Mother     S/P RAI  . Diabetes Neg Hx   . Stroke Neg Hx   . Cancer Neg Hx   . Heart disease Neg Hx    History  Substance Use Topics  . Smoking status: Never Smoker   . Smokeless tobacco: Not on file  . Alcohol Use: No    Review of Systems  All other systems reviewed and are negative.     Allergies  Review of patient's allergies indicates no known allergies.  Home Medications   Prior to Admission medications   Medication Sig Start Date End Date Taking?  Authorizing Provider  loratadine (CLARITIN) 10 MG tablet Take 10 mg by mouth daily as needed for allergies.   Yes Historical Provider, MD   BP 117/76  Pulse 87  Temp(Src) 98 F (36.7 C) (Oral)  Resp 16  SpO2 99%  Vital signs normal   Physical Exam  Nursing note and vitals reviewed. Constitutional: He is oriented to person, place, and time. He appears well-developed and well-nourished.  Non-toxic appearance. He does not appear ill. No distress.  Patient is laying on his right side in fetal position  HENT:  Head: Normocephalic and atraumatic.  Right Ear: External ear normal.  Left Ear: External ear normal.  Nose: Nose normal. No mucosal edema or rhinorrhea.  Mouth/Throat: Mucous membranes are normal. No dental abscesses or uvula swelling.  Tongue is dry  Eyes: Conjunctivae and EOM are normal. Pupils are equal, round, and reactive to light.  Neck: Normal range of motion and full passive range of motion without pain. Neck supple.  Cardiovascular: Normal rate, regular rhythm and normal heart sounds.  Exam reveals no gallop and no friction rub.   No murmur heard. Pulmonary/Chest: Effort normal and breath sounds normal. No respiratory distress. He has no wheezes. He has no rhonchi. He has no rales. He exhibits no tenderness and no crepitus.  Abdominal: Soft. Normal appearance and bowel sounds are normal. He exhibits no distension. There is tenderness. There is no rebound and no guarding.    Patient has some tenderness in his right lower quadrant without guarding or rebound  Musculoskeletal: Normal range of motion. He exhibits no edema and no tenderness.  Moves all extremities well.   Neurological: He is alert and oriented to person, place, and time. He has normal strength. No cranial nerve deficit.  Skin: Skin is warm, dry and intact. No rash noted. No erythema. No pallor.  Psychiatric: He has a normal mood and affect. His speech is normal and behavior is normal. His mood appears not  anxious.    ED Course  Procedures (including critical care time)  Medications  0.9 %  sodium chloride infusion (0 mLs Intravenous Stopped 07/21/14 1349)    Followed by  0.9 %  sodium chloride infusion (0 mLs Intravenous Stopped 07/21/14 1609)    Followed by  0.9 %  sodium chloride infusion (1,000 mLs Intravenous New Bag/Given 07/21/14 1625)  ondansetron (ZOFRAN) injection 4 mg (4 mg Intravenous Given 07/21/14 1200)  ondansetron (ZOFRAN) injection 4 mg (4 mg Intravenous Given 07/21/14 1350)  morphine 4 MG/ML injection 4 mg (4 mg Intravenous Given 07/21/14 1207)  iohexol (OMNIPAQUE) 300 MG/ML solution 50 mL (50 mLs Oral Contrast Given 07/21/14 1309)  iohexol (OMNIPAQUE) 300 MG/ML solution 100 mL (100 mLs Intravenous Contrast Given 07/21/14 1432)  morphine 4 MG/ML injection 4 mg (4 mg Intravenous Given 07/21/14 1458)  HYDROmorphone (DILAUDID) injection 0.5 mg (0.5 mg Intravenous Given 07/21/14 1625)    Review of patient's prior x-ray studies shows he had CT of the abdomen and pelvis in 2003 that was normal and also in 2009 with a questionable area of segmental inflammation of the distal colon. There was no mention of appendicitis or inflammation of the appendix in either CT study.  Pt given results of his tests and expected course with a renal stone.   Labs Review Results for orders placed during the hospital encounter of 07/21/14  CBC WITH DIFFERENTIAL      Result Value Ref Range   WBC 11.5 (*) 4.0 - 10.5 K/uL   RBC 5.32  4.22 - 5.81 MIL/uL   Hemoglobin 15.1  13.0 - 17.0 g/dL   HCT 44.6  39.0 - 52.0 %   MCV 83.8  78.0 - 100.0 fL   MCH 28.4  26.0 - 34.0 pg   MCHC 33.9  30.0 - 36.0 g/dL   RDW 13.2  11.5 - 15.5 %   Platelets 255  150 - 400 K/uL   Neutrophils Relative % 89 (*) 43 - 77 %   Neutro Abs 10.2 (*) 1.7 - 7.7 K/uL   Lymphocytes Relative 7 (*) 12 - 46 %   Lymphs Abs 0.8  0.7 - 4.0 K/uL   Monocytes Relative 4  3 - 12 %   Monocytes Absolute 0.5  0.1 - 1.0 K/uL   Eosinophils Relative 0  0 - 5  %   Eosinophils Absolute 0.0  0.0 - 0.7 K/uL   Basophils Relative 0  0 - 1 %   Basophils Absolute 0.0  0.0 - 0.1 K/uL  COMPREHENSIVE METABOLIC PANEL      Result Value Ref Range   Sodium 140  137 - 147 mEq/L   Potassium 4.2  3.7 - 5.3 mEq/L   Chloride 101  96 - 112 mEq/L   CO2 28  19 - 32 mEq/L   Glucose,  Bld 121 (*) 70 - 99 mg/dL   BUN 16  6 - 23 mg/dL   Creatinine, Ser 1.31  0.50 - 1.35 mg/dL   Calcium 9.5  8.4 - 10.5 mg/dL   Total Protein 7.7  6.0 - 8.3 g/dL   Albumin 4.5  3.5 - 5.2 g/dL   AST 12  0 - 37 U/L   ALT 14  0 - 53 U/L   Alkaline Phosphatase 41  39 - 117 U/L   Total Bilirubin 0.8  0.3 - 1.2 mg/dL   GFR calc non Af Amer 66 (*) >90 mL/min   GFR calc Af Amer 77 (*) >90 mL/min   Anion gap 11  5 - 15  LIPASE, BLOOD      Result Value Ref Range   Lipase 22  11 - 59 U/L  URINALYSIS, ROUTINE W REFLEX MICROSCOPIC      Result Value Ref Range   Color, Urine YELLOW  YELLOW   APPearance CLEAR  CLEAR   Specific Gravity, Urine 1.042 (*) 1.005 - 1.030   pH 6.0  5.0 - 8.0   Glucose, UA NEGATIVE  NEGATIVE mg/dL   Hgb urine dipstick NEGATIVE  NEGATIVE   Bilirubin Urine NEGATIVE  NEGATIVE   Ketones, ur NEGATIVE  NEGATIVE mg/dL   Protein, ur NEGATIVE  NEGATIVE mg/dL   Urobilinogen, UA 0.2  0.0 - 1.0 mg/dL   Nitrite NEGATIVE  NEGATIVE   Leukocytes, UA NEGATIVE  NEGATIVE   Laboratory interpretation all normal except for leukocytosis     Imaging Review Ct Abdomen Pelvis W Contrast  07/21/2014   CLINICAL DATA:  Right lower quadrant abdominal pain.  Vomiting.  EXAM: CT ABDOMEN AND PELVIS WITH CONTRAST  TECHNIQUE: Multidetector CT imaging of the abdomen and pelvis was performed using the standard protocol following bolus administration of intravenous contrast.  CONTRAST:  16mL OMNIPAQUE IOHEXOL 300 MG/ML SOLN, 127mL OMNIPAQUE IOHEXOL 300 MG/ML SOLN  COMPARISON:  03/16/2008  FINDINGS: There is slightly delayed enhancement and associated excretion from the right kidney. Note is made of a  punctate (approximately 2 mm) partially obstructing stone within distal aspect of the right ureter (image 76, series 2) with associated mild upstream ureterectasis and, pelvicaliectasis and asymmetric right-sided perinephric stranding. 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 No additional renal stones are identified. Normal appearance of the left kidney. No urinary obstruction. No discrete renal lesions. Normal appearance of the urinary bladder.  Normal hepatic contour. The sub cm hypo attenuating lesions within the subcapsular aspects of the medial segment of the left lobe of the liver (images 11 and 14, series 2, unchanged since the 2009 examination and [place] favored to represent hepatic cysts. There is a minimal amount of focal fatty infiltration adjacent to the fissure for ligamentum teres. A punctate granulomas known with the subcapsular aspect of the anterior segment of the right lobe of the liver (image 23, series 2). Normal appearance of the gallbladder. No radiopaque gallstones. No intra or extrahepatic biliary duct dilatation. No ascites.  Normal appearance of the bilateral adrenal glands, pancreas and spleen.  A very minimal amount of enteric contrast has been ingested and extends to the proximal small bowel. The bowel is normal in course and caliber without wall thickening or evidence of obstruction. Normal appearance of the retrocecal appendix. No pneumoperitoneum, pneumatosis or portal venous gas.  Normal caliber the abdominal aorta. The major branch vessels of the abdominal aorta appear widely patent on this non CTA examination. No  retroperitoneal, mesenteric, pelvic or inguinal lymphadenopathy.  Normal appearance of the pelvic organs. No free fluid in the pelvic cul-de-sac. Several phleboliths are seen within the lower pelvis bilaterally, left greater than right.  Limited visualization of the lower thorax demonstrates an unchanged approximately 5 mm noncalcified  nodule within the imaged left lower lobe (image 2, series 4), stable since the 2009 examination and thus of benign etiology. Unchanged punctate (approximately 2 mm) calcified granuloma is noted within the imaged right lower lobe (image 8, series 4). There is minimal dependent bibasilar atelectasis, right greater than left. No discrete focal airspace opacities. No pleural effusion.  Normal heart size.  No pericardial effusion.  No acute or aggressive osseus abnormalities. Mild to moderate DDD of L5-S1 with disc space height loss, endplate irregularity and sclerosis. Regional soft tissues appear normal.  IMPRESSION: 1. Punctate (approximately 2 mm) partially obstructing stone within the distal aspect of the right ureter results in mild upstream ureterectasis and pelvicaliectasis associated slightly delayed enhancement and excretion of the right kidney. 2. No other renal stones identified. No left-sided urinary obstruction. 3. 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 4. Unchanged 5 mm noncalcified nodule within the left lower lobe, stable since the 2009 examination and thus of benign etiology.   Electronically Signed   By: Sandi Mariscal M.D.   On: 07/21/2014 15:03     EKG Interpretation None      MDM   Final diagnoses:  Ureterolithiasis    New Prescriptions   ONDANSETRON (ZOFRAN) 4 MG TABLET    Take 1 tablet (4 mg total) by mouth every 8 (eight) hours as needed for nausea or vomiting.   OXYCODONE-ACETAMINOPHEN (PERCOCET/ROXICET) 5-325 MG PER TABLET    Take 1-2 tablets by mouth every 4 (four) hours as needed for severe pain.   TAMSULOSIN (FLOMAX) 0.4 MG CAPS CAPSULE    Take 1 po QD until you pass the stone.    Plan discharge  Rolland Porter, MD, Alanson Aly, MD 07/21/14 [phone]

## 2014-10-02 DIAGNOSIS — N200 Calculus of kidney: Secondary | ICD-10-CM

## 2014-10-02 HISTORY — DX: Calculus of kidney: N20.0

## 2015-02-12 ENCOUNTER — Ambulatory Visit: Payer: Self-pay | Admitting: Internal Medicine

## 2015-07-07 ENCOUNTER — Telehealth: Payer: Self-pay

## 2015-07-07 NOTE — Telephone Encounter (Signed)
LMOVM

## 2015-07-08 ENCOUNTER — Other Ambulatory Visit: Payer: Self-pay

## 2015-07-09 ENCOUNTER — Encounter: Payer: Self-pay | Admitting: Internal Medicine

## 2015-07-09 ENCOUNTER — Ambulatory Visit (INDEPENDENT_AMBULATORY_CARE_PROVIDER_SITE_OTHER): Payer: BLUE CROSS/BLUE SHIELD | Admitting: Internal Medicine

## 2015-07-09 VITALS — BP 116/76 | HR 60 | Temp 97.4°F | Ht 74.5 in | Wt 184.0 lb

## 2015-07-09 DIAGNOSIS — Z0000 Encounter for general adult medical examination without abnormal findings: Secondary | ICD-10-CM

## 2015-07-09 DIAGNOSIS — N200 Calculus of kidney: Secondary | ICD-10-CM

## 2015-07-09 DIAGNOSIS — R0600 Dyspnea, unspecified: Secondary | ICD-10-CM

## 2015-07-09 LAB — CBC WITH DIFFERENTIAL/PLATELET
BASOS ABS: 0 10*3/uL (ref 0.0–0.1)
Basophils Relative: 0.4 % (ref 0.0–3.0)
Eosinophils Absolute: 0.2 10*3/uL (ref 0.0–0.7)
Eosinophils Relative: 2.8 % (ref 0.0–5.0)
HCT: 48.2 % (ref 39.0–52.0)
Hemoglobin: 16.3 g/dL (ref 13.0–17.0)
LYMPHS ABS: 1.9 10*3/uL (ref 0.7–4.0)
Lymphocytes Relative: 30.5 % (ref 12.0–46.0)
MCHC: 33.8 g/dL (ref 30.0–36.0)
MCV: 85.2 fl (ref 78.0–100.0)
MONO ABS: 0.4 10*3/uL (ref 0.1–1.0)
Monocytes Relative: 5.8 % (ref 3.0–12.0)
NEUTROS PCT: 60.5 % (ref 43.0–77.0)
Neutro Abs: 3.7 10*3/uL (ref 1.4–7.7)
PLATELETS: 267 10*3/uL (ref 150.0–400.0)
RBC: 5.65 Mil/uL (ref 4.22–5.81)
RDW: 14 % (ref 11.5–15.5)
WBC: 6.1 10*3/uL (ref 4.0–10.5)

## 2015-07-09 LAB — URINALYSIS, ROUTINE W REFLEX MICROSCOPIC
Bilirubin Urine: NEGATIVE
Hgb urine dipstick: NEGATIVE
Ketones, ur: NEGATIVE
LEUKOCYTES UA: NEGATIVE
Nitrite: NEGATIVE
TOTAL PROTEIN, URINE-UPE24: NEGATIVE
URINE GLUCOSE: NEGATIVE
UROBILINOGEN UA: 0.2 (ref 0.0–1.0)
pH: 6.5 (ref 5.0–8.0)

## 2015-07-09 LAB — COMPREHENSIVE METABOLIC PANEL
ALT: 15 U/L (ref 0–53)
AST: 12 U/L (ref 0–37)
Albumin: 4.3 g/dL (ref 3.5–5.2)
Alkaline Phosphatase: 43 U/L (ref 39–117)
BILIRUBIN TOTAL: 0.7 mg/dL (ref 0.2–1.2)
BUN: 17 mg/dL (ref 6–23)
CALCIUM: 9.3 mg/dL (ref 8.4–10.5)
CO2: 31 mEq/L (ref 19–32)
CREATININE: 1.11 mg/dL (ref 0.40–1.50)
Chloride: 101 mEq/L (ref 96–112)
GFR: 77.01 mL/min (ref >60.00)
Glucose, Bld: 92 mg/dL (ref 70–99)
Potassium: 4.5 mEq/L (ref 3.5–5.1)
Sodium: 138 mEq/L (ref 135–145)
Total Protein: 7.5 g/dL (ref 6.0–8.3)

## 2015-07-09 LAB — LIPID PANEL
CHOLESTEROL: 187 mg/dL (ref 0–200)
HDL: 41.7 mg/dL (ref >39.00)
LDL Cholesterol: 129 mg/dL — ABNORMAL HIGH (ref 0–99)
NonHDL: 145.31
TRIGLYCERIDES: 83 mg/dL (ref 0.0–149.0)
Total CHOL/HDL Ratio: 4
VLDL: 16.6 mg/dL (ref 0.0–40.0)

## 2015-07-09 LAB — HIV ANTIBODY (ROUTINE TESTING W REFLEX): HIV: NONREACTIVE

## 2015-07-09 LAB — TSH: TSH: 1.52 u[IU]/mL (ref 0.35–4.50)

## 2015-07-09 NOTE — Assessment & Plan Note (Signed)
Exercise stress test -2014 PFTs 2014 mild restriction TLC, otherwise normal

## 2015-07-09 NOTE — Assessment & Plan Note (Addendum)
Td 2010  Colon cancer screening: Never had a colonoscopy , no family history Prostate cancer screening: Not indicated  Labs Diet and exercise discussed Had an EKG May 2016:reviewed EKG today sinus rhythm, no acute or worrisome changes. Patient is asymptomatic.

## 2015-07-09 NOTE — Patient Instructions (Addendum)
Get your blood work before you leave     Next visit in one year for a physical exam, fasting.

## 2015-07-09 NOTE — Progress Notes (Signed)
Pre visit review using our clinic review tool, if applicable. No additional management support is needed unless otherwise documented below in the visit note. 

## 2015-07-09 NOTE — Progress Notes (Signed)
Subjective:    Patient ID: Michael Sampson, male    DOB: August 19, 1971, 44 y.o.   MRN: [id number]  DOS:  07/09/2015 Type of visit - description : New patient, request a CPX   Reports occasional palpitation, usually associated with acute sickness, went to urgent care May 2016 with a strep throat, EKG was done and he was recommended to share it with me. Occasional fatigue, denies snoring, he feels a sleepy sometimes. Also concerned about his family history of thyroid disease.   Review of Systems Constitutional: No fever. No chills. No unexplained wt changes. No unusual sweats  HEENT:   no ear discharge, no facial swelling, no voice changes. No eye discharge, no eye  redness , no  intolerance to light   Respiratory: No wheezing , no  difficulty breathing. No cough , no mucus production  Cardiovascular: No CP, no leg swelling , no  Palpitations  GI: no nausea, no vomiting, no diarrhea , no  abdominal pain.  No blood in the stools. No dysphagia, no odynophagia    Endocrine: No polyphagia, no polyuria , no polydipsia  GU: No dysuria, gross hematuria, difficulty urinating. No urinary urgency, no frequency.  Musculoskeletal:  occ joint aches   Skin: No change in the color of the skin, palor , no  Rash  Allergic, immunologic: No environmental allergies , no  food allergies  Neurological: No dizziness no  syncope. No headaches. No diplopia, no slurred, no slurred speech, no motor deficits, no facial  Numbness  Hematological: No enlarged lymph nodes, no easy bruising , no unusual bleedings  Psychiatry: No suicidal ideas, no hallucinations, no beavior problems, no confusion.  No unusual/severe anxiety, no depression   Past Medical History  Diagnosis Date  . Mitral valve prolapse     no SBE prophylaxis  . Hyperlipidemia   . Calculus of kidney 10/02/2014    Past Surgical History  Procedure Laterality Date  . Pectus carinatum      surgical correction at age 13  . Wisdom tooth  extraction      Social History   Social History  . Marital Status: Single    Spouse Name: N/A  . Number of Children: 2  . Years of Education: N/A   Occupational History  . plant Chief Financial Officer     Social History Main Topics  . Smoking status: Never Smoker   . Smokeless tobacco: Never Used     Comment: + tobacco exposure   . Alcohol Use: No  . Drug Use: No  . Sexual Activity: Not on file   Other Topics Concern  . Not on file   Social History Narrative   Household-- pt , wife and 2 children     Family History  Problem Relation Age of Onset  . Hyperlipidemia Father   . COPD Father   . Graves' disease Sister   . Heart murmur Mother   . Hyperthyroidism Mother     S/P RAI  . Diabetes Neg Hx   . Stroke Neg Hx   . Cancer Neg Hx   . Heart disease Neg Hx        Medication List    Notice  As of 07/09/2015 11:59 PM   You have not been prescribed any medications.         Objective:   Physical Exam BP 116/76 mmHg  Pulse 60  Temp(Src) 97.4 F (36.3 C) (Oral)  Ht 6' 2.5" (1.892 m)  Wt 184 lb (83.462 kg)  BMI  23.32 kg/m2  SpO2 99% General:   Well developed, well nourished . NAD.  Neck:  No  Thyromegaly  HEENT:  Normocephalic . Face symmetric, atraumatic Lungs:  CTA B Normal respiratory effort, no intercostal retractions, no accessory muscle use. Heart: RRR,  no murmur.  No pretibial edema bilaterally  Abdomen:  Not distended, soft, non-tender. No rebound or rigidity.  Skin: Exposed areas without rash. Not pale. Not jaundice Neurologic:  alert & oriented X3.  Speech normal, gait appropriate for age and unassisted Strength symmetric and appropriate for age.  Psych: Cognition and judgment appear intact.  Cooperative with normal attention span and concentration.  Behavior appropriate. No anxious or depressed appearing.    Assessment & Plan:   Occasional fatigue: Check labs; epworth sleepiness scale: Scored 0

## 2015-07-09 NOTE — Assessment & Plan Note (Addendum)
Was seen at the ER 07-2014, CT report below, states he passed a stone and saw urologist, currently asymptomatic.: 1. Punctate (approximately 2 mm) partially obstructing stone within the distal aspect of the right ureter results in mild upstream ureterectasis and pelvicaliectasis associated slightly delayed enhancement and excretion of the right kidney. 2. No other renal stones identified. No left-sided urinary obstruction. 3. 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 4. Unchanged 5 mm noncalcified nodule within the left lower lobe, stable since the 2009 examination and thus of benign etiology.

## 2015-12-27 IMAGING — CT CT ABD-PELV W/ CM
2 of 5 series · 15 of 46 positions shown, 17 images · IV contrast (OMNIPAQUE)
Comparison: 03/16/2008

CLINICAL DATA: Right lower quadrant abdominal pain.  Vomiting.

EXAM:
CT ABDOMEN AND PELVIS WITH CONTRAST
TECHNIQUE: Multidetector CT imaging of the abdomen and pelvis was performed
using the standard protocol following bolus administration of
intravenous contrast.
CONTRAST:  50mL OMNIPAQUE IOHEXOL 300 MG/ML SOLN, 100mL OMNIPAQUE
IOHEXOL 300 MG/ML SOLN

[Series 2: rtn a/p with · axial · 0.70mm/px · z∈[-552,-132]mm · 12 of 96 slices shown, 14 images]
[im 6/96  soft-tissue]
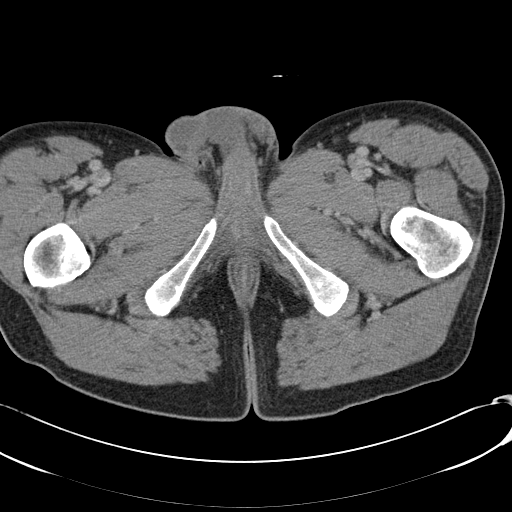
[im 6/96  bone]
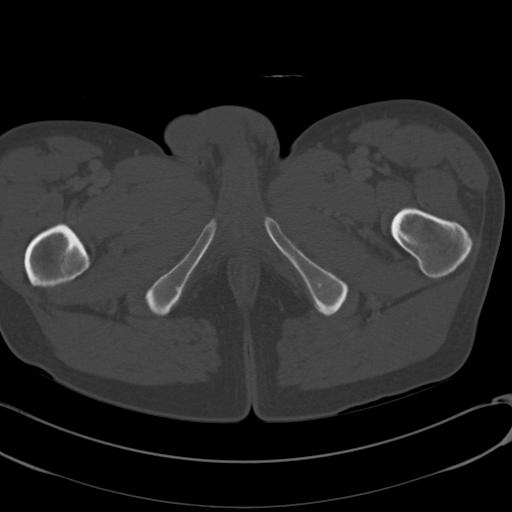
[im 16/96  soft-tissue]
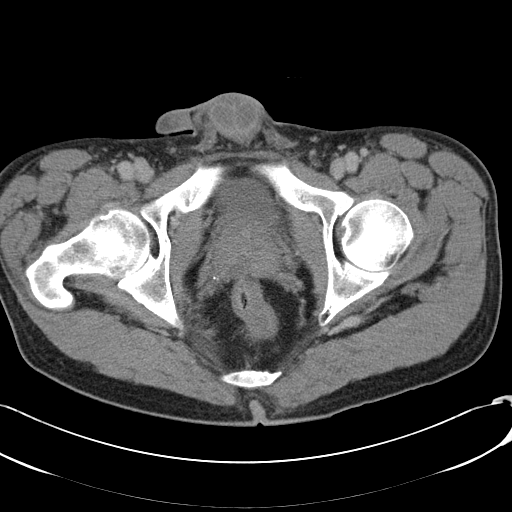
[im 22/96  soft-tissue]
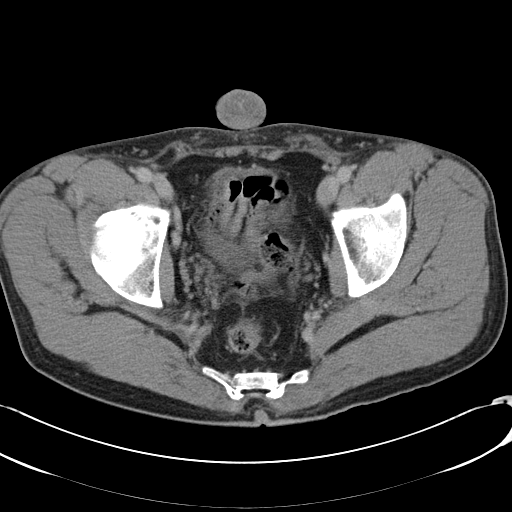
[im 27/96  soft-tissue]
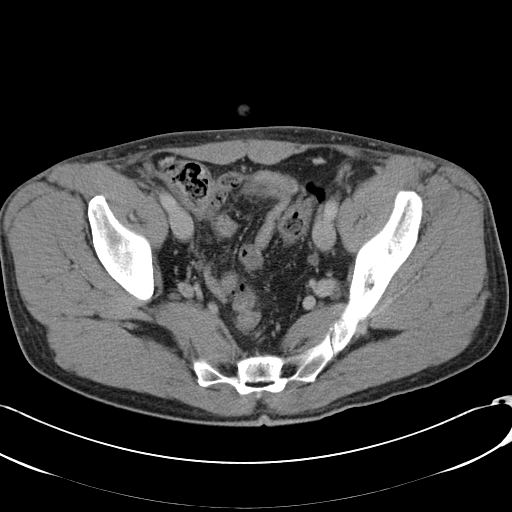
[im 37/96  soft-tissue]
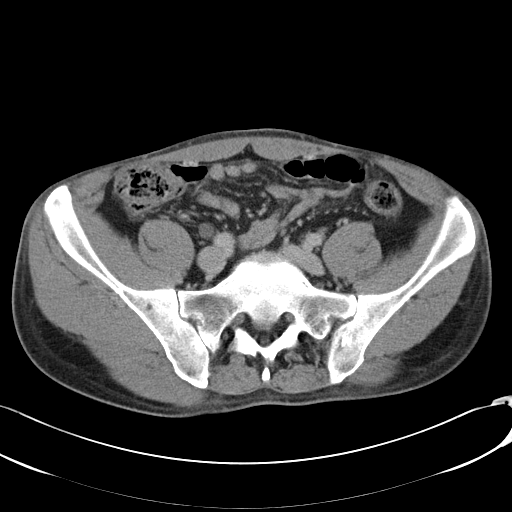
[im 43/96  soft-tissue]
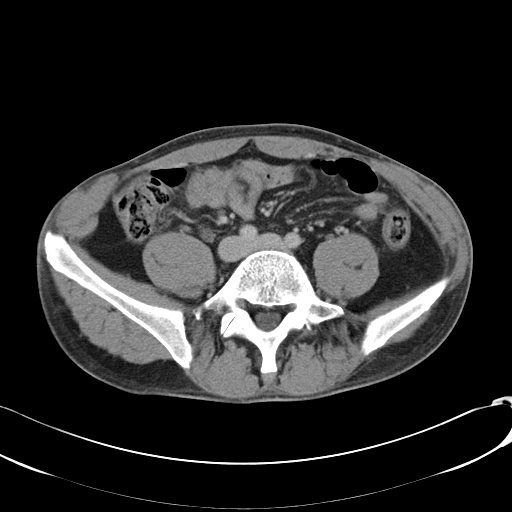
[im 53/96  soft-tissue]
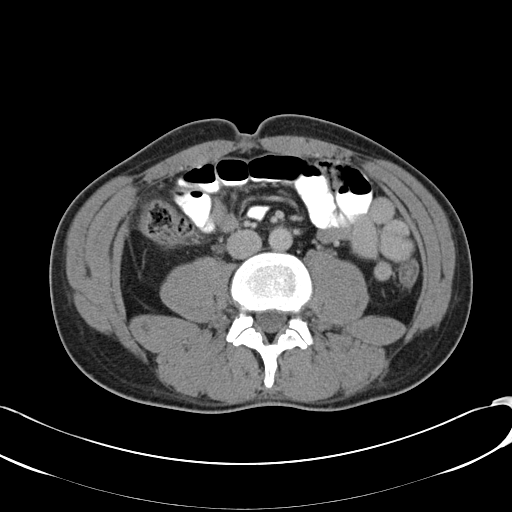
[im 59/96  soft-tissue]
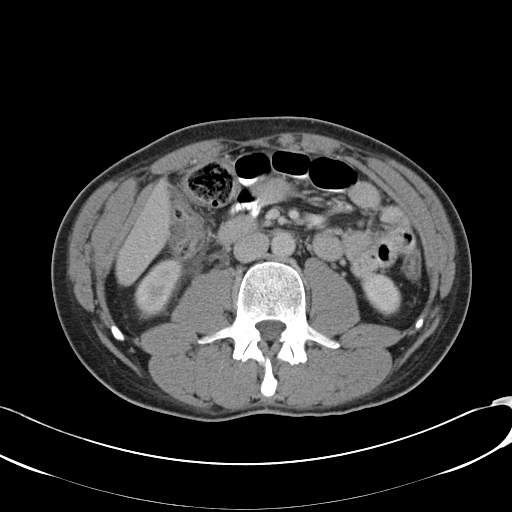
[im 69/96  soft-tissue]
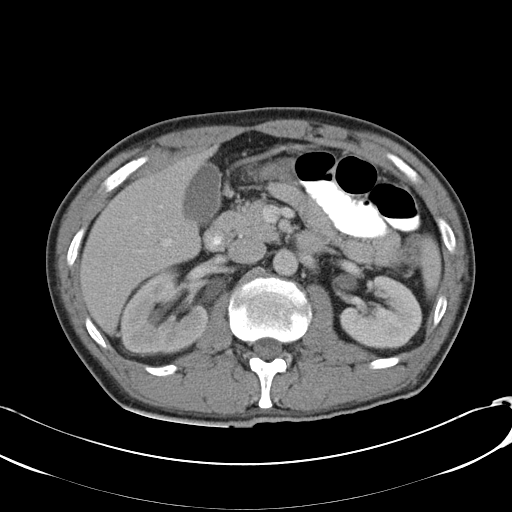
[im 69/96  bone]
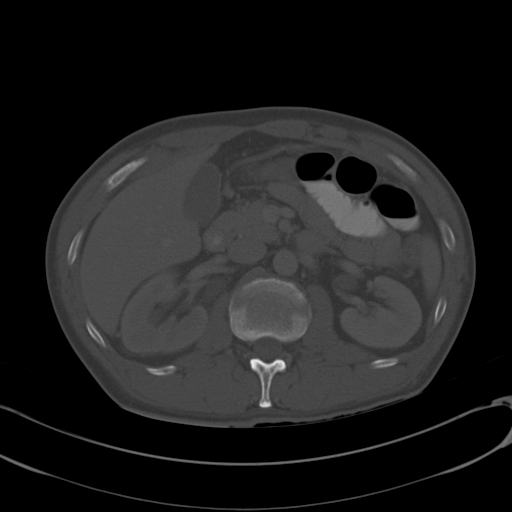
[im 74/96  soft-tissue]
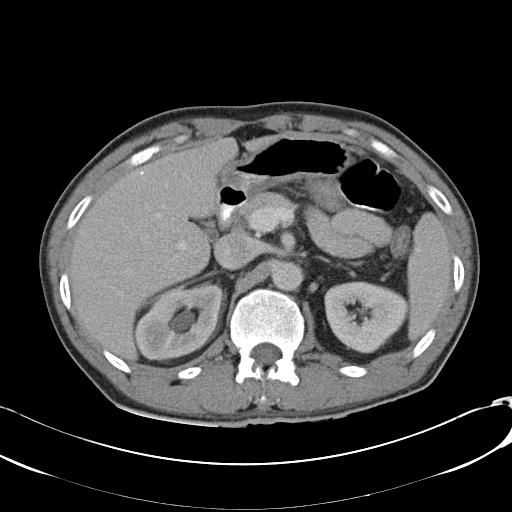
[im 80/96  soft-tissue]
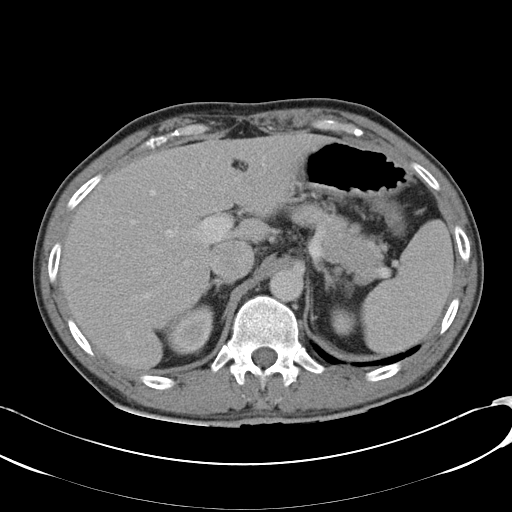
[im 90/96  soft-tissue]
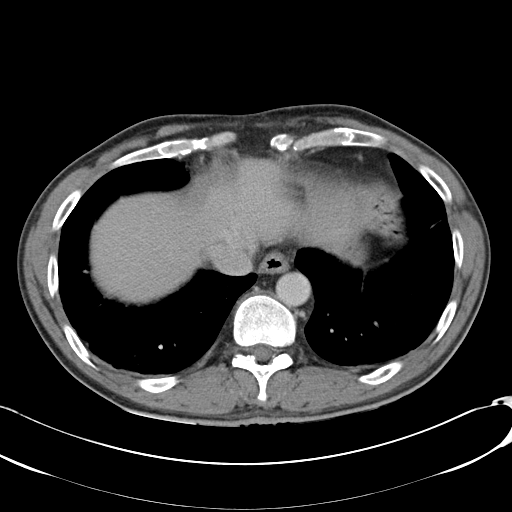

[Series 602: <mpr thick range> · coronal · 0.94mm/px · 3 of 111 slices shown]
[im 37/111  soft-tissue]
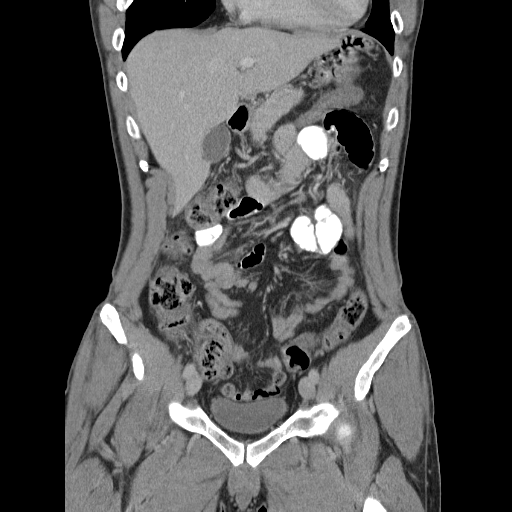
[im 49/111  soft-tissue]
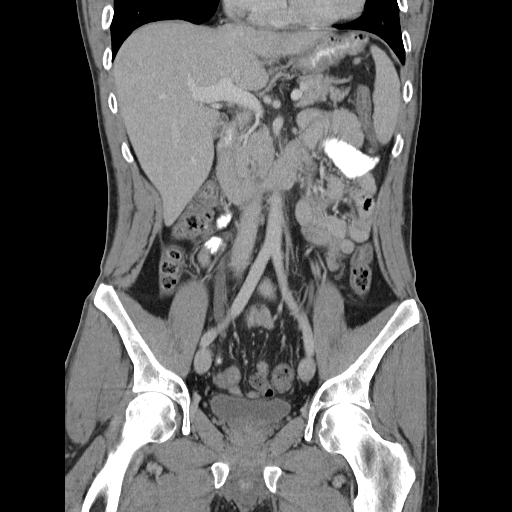
[im 62/111  soft-tissue]
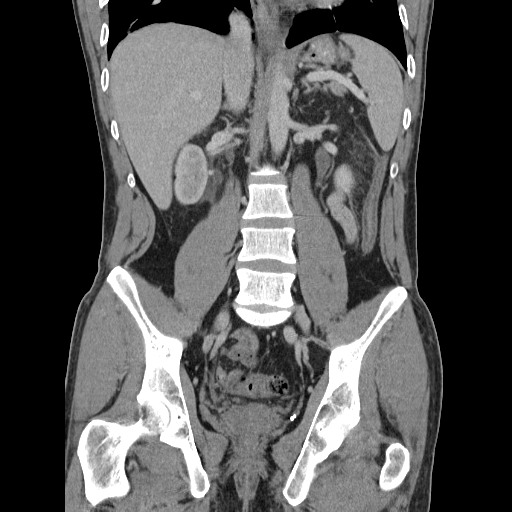

[15 of 46 positions shown; findings below may reference images not displayed]

FINDINGS: There is slightly delayed enhancement and associated excretion from
the right kidney. Note is made of a punctate (approximately 2 mm)
partially obstructing stone within distal aspect of the right ureter
(image 76, series 2) with associated mild upstream ureterectasis
and, pelvicaliectasis and asymmetric right-sided perinephric
stranding. Incidental note is made of a partially duplicated right
renal collecting system. No additional renal stones are identified.
Normal appearance of the left kidney. No urinary obstruction. No
discrete renal lesions. Normal appearance of the urinary bladder.

Normal hepatic contour. The sub cm hypo attenuating lesions within
the subcapsular aspects of the medial segment of the left lobe of
the liver (images 11 and 14, series 2, unchanged since the 6668
examination and [place] favored to represent hepatic cysts. There is a
minimal amount of focal fatty infiltration adjacent to the fissure
for ligamentum teres. A punctate granulomas known with the
subcapsular aspect of the anterior segment of the right lobe of the
liver (image 23, series 2). Normal appearance of the gallbladder. No
radiopaque gallstones. No intra or extrahepatic biliary duct
dilatation. No ascites.

Normal appearance of the bilateral adrenal glands, pancreas and
spleen.

A very minimal amount of enteric contrast has been ingested and
extends to the proximal small bowel. The bowel is normal in course
and caliber without wall thickening or evidence of obstruction.
Normal appearance of the retrocecal appendix. No pneumoperitoneum,
pneumatosis or portal venous gas.

Normal caliber the abdominal aorta. The major branch vessels of the
abdominal aorta appear widely patent on this non CTA examination. No
retroperitoneal, mesenteric, pelvic or inguinal lymphadenopathy.

Normal appearance of the pelvic organs. No free fluid in the pelvic
cul-de-sac. Several phleboliths are seen within the lower pelvis
bilaterally, left greater than right.

Limited visualization of the lower thorax demonstrates an unchanged
approximately 5 mm noncalcified nodule within the imaged left lower
lobe (image 2, series 4), stable since the 6668 examination and thus
of benign etiology. Unchanged punctate (approximately 2 mm)
calcified granuloma is noted within the imaged right lower lobe
(image 8, series 4). There is minimal dependent bibasilar
atelectasis, right greater than left. No discrete focal airspace
opacities. No pleural effusion.

Normal heart size.  No pericardial effusion.

No acute or aggressive osseus abnormalities. Mild to moderate DDD of
L5-S1 with disc space height loss, endplate irregularity and
sclerosis. Regional soft tissues appear normal.
IMPRESSION: 1. Punctate (approximately 2 mm) partially obstructing stone within
the distal aspect of the right ureter results in mild upstream
ureterectasis and pelvicaliectasis associated slightly delayed
enhancement and excretion of the right kidney.
2. No other renal stones identified. No left-sided urinary
obstruction.
3. Incidentally noted partial duplication of the right renal
collecting system.
4. Unchanged 5 mm noncalcified nodule within the left lower lobe,
stable since the 6668 examination and thus of benign etiology.

## 2016-02-04 ENCOUNTER — Encounter: Payer: Self-pay | Admitting: Internal Medicine

## 2016-02-04 ENCOUNTER — Ambulatory Visit (INDEPENDENT_AMBULATORY_CARE_PROVIDER_SITE_OTHER): Payer: BLUE CROSS/BLUE SHIELD | Admitting: Internal Medicine

## 2016-02-04 VITALS — BP 108/66 | HR 67 | Temp 98.4°F | Ht 75.0 in | Wt 182.2 lb

## 2016-02-04 DIAGNOSIS — R109 Unspecified abdominal pain: Secondary | ICD-10-CM

## 2016-02-04 DIAGNOSIS — Z09 Encounter for follow-up examination after completed treatment for conditions other than malignant neoplasm: Secondary | ICD-10-CM

## 2016-02-04 DIAGNOSIS — K5900 Constipation, unspecified: Secondary | ICD-10-CM | POA: Diagnosis not present

## 2016-02-04 LAB — URINALYSIS, ROUTINE W REFLEX MICROSCOPIC
Bilirubin Urine: NEGATIVE
Hgb urine dipstick: NEGATIVE
Ketones, ur: NEGATIVE
Leukocytes, UA: NEGATIVE
Nitrite: NEGATIVE
PH: 7 (ref 5.0–8.0)
Specific Gravity, Urine: <=1.005 — AB (ref 1.000–1.030)
Total Protein, Urine: NEGATIVE
Urine Glucose: NEGATIVE
Urobilinogen, UA: 0.2 (ref 0.0–1.0)

## 2016-02-04 LAB — COMPLETE METABOLIC PANEL WITH GFR
ALBUMIN: 4 g/dL (ref 3.6–5.1)
ALK PHOS: 32 U/L — AB (ref 40–115)
ALT: 18 U/L (ref 9–46)
AST: 13 U/L (ref 10–40)
BILIRUBIN TOTAL: 1.1 mg/dL (ref 0.2–1.2)
BUN: 17 mg/dL (ref 7–25)
CO2: 27 mmol/L (ref 20–31)
CREATININE: 1.05 mg/dL (ref 0.60–1.35)
Calcium: 9 mg/dL (ref 8.6–10.3)
Chloride: 103 mmol/L (ref 98–110)
GFR, Est Non African American: 87 mL/min (ref >=60)
Glucose, Bld: 88 mg/dL (ref 65–99)
Potassium: 4.1 mmol/L (ref 3.5–5.3)
Sodium: 140 mmol/L (ref 135–146)
TOTAL PROTEIN: 6.8 g/dL (ref 6.1–8.1)

## 2016-02-04 LAB — CBC WITH DIFFERENTIAL/PLATELET
Basophils Absolute: 0 10*3/uL (ref 0.0–0.1)
Basophils Relative: 0.4 % (ref 0.0–3.0)
Eosinophils Absolute: 0.1 10*3/uL (ref 0.0–0.7)
Eosinophils Relative: 1.1 % (ref 0.0–5.0)
HCT: 43.3 % (ref 39.0–52.0)
HEMOGLOBIN: 14.7 g/dL (ref 13.0–17.0)
LYMPHS PCT: 26.1 % (ref 12.0–46.0)
Lymphs Abs: 1.7 10*3/uL (ref 0.7–4.0)
MCHC: 34 g/dL (ref 30.0–36.0)
MCV: 85.6 fl (ref 78.0–100.0)
Monocytes Absolute: 0.4 10*3/uL (ref 0.1–1.0)
Monocytes Relative: 6.3 % (ref 3.0–12.0)
NEUTROS ABS: 4.3 10*3/uL (ref 1.4–7.7)
Neutrophils Relative %: 66.1 % (ref 43.0–77.0)
Platelets: 285 10*3/uL (ref 150.0–400.0)
RBC: 5.05 Mil/uL (ref 4.22–5.81)
RDW: 14.3 % (ref 11.5–15.5)
WBC: 6.5 10*3/uL (ref 4.0–10.5)

## 2016-02-04 MED ORDER — CYCLOBENZAPRINE HCL 10 MG PO TABS: 10.0000 mg | ORAL_TABLET | Freq: Every evening | ORAL | Status: DC | PRN

## 2016-02-04 NOTE — Progress Notes (Signed)
Pre visit review using our clinic review tool, if applicable. No additional management support is needed unless otherwise documented below in the visit note. 

## 2016-02-04 NOTE — Patient Instructions (Addendum)
GO TO THE LAB :      Get the blood work     [place] schedule your next appointment for a  Follow up When?   6 weeks Fasting?  No    MiraLAX OTC: 17 g daily with fluids for the next 2 weeks, then as needed for constipation 1 probiotic daily such as ALIGN for the next  6 weeks  For pain: Stretching, Tylenol, Flexeril at night (muscle relaxant, will cause drowsiness)   [facility] Website with information about home physical therapy for back pain: [email or web address]

## 2016-02-04 NOTE — Progress Notes (Signed)
Subjective:    Patient ID: Michael Sampson, male    DOB: 1971-06-15, 44 y.o.   MRN: [id number]  DOS:  02/04/2016 Type of visit - description : Acute visit Interval history: Approximately 2 months h/o  pain at the left lower quadrant, left flank, left lower back. Symptoms are on and off, increases when he moves, stands or bends. Sometimes the left groin hurts. Denies any bulging area there or scrotal pain.  Also 4 weeks history of constipation described as dry stools, stools look less dark than usual. Denies any change on his diet. He did take Augmentin last month for a sinus infection  Wt Readings from Last 3 Encounters:  02/04/16 182 lb 4 oz (82.668 kg)  07/09/15 184 lb (83.462 kg)  02/23/14 179 lb (81.194 kg)     Review of Systems  No fever chills or weight loss Occasional nausea but no vomiting, no diarrhea, blood or mucus in the stools. No dysuria, gross hematuria or difficulty urinating.  Past Medical History  Diagnosis Date  . Mitral valve prolapse     no SBE prophylaxis  . Hyperlipidemia   . Calculus of kidney 10/02/2014    Past Surgical History  Procedure Laterality Date  . Pectus carinatum      surgical correction at age 21  . Wisdom tooth extraction      Social History   Social History  . Marital Status: Single    Spouse Name: N/A  . Number of Children: 2  . Years of Education: N/A   Occupational History  . plant Chief Financial Officer     Social History Main Topics  . Smoking status: Never Smoker   . Smokeless tobacco: Never Used     Comment: + tobacco exposure   . Alcohol Use: No  . Drug Use: No  . Sexual Activity: Not on file   Other Topics Concern  . Not on file   Social History Narrative   Household-- pt , wife and 2 children        Medication List       This list is accurate as of: 02/04/16  5:58 PM.  Always use your most recent med list.               cyclobenzaprine 10 MG tablet  Commonly known as:  FLEXERIL  Take 1 tablet (10 mg  total) by mouth at bedtime as needed for muscle spasms.           Objective:   Physical Exam  Skin:      BP 108/66 mmHg  Pulse 67  Temp(Src) 98.4 F (36.9 C) (Oral)  Ht 6\' 3"  (1.905 m)  Wt 182 lb 4 oz (82.668 kg)  BMI 22.78 kg/m2  SpO2 99% General:   Well developed, well nourished . NAD.  HEENT:  Normocephalic . Face symmetric, atraumatic Lungs:  CTA B Normal respiratory effort, no intercostal retractions, no accessory muscle use. Heart: RRR,  no murmur.  no pretibial edema bilaterally  Abdomen:  Not distended, soft, non-tender. No rebound or rigidity.  Groin: No hernias Normal scrotal contents MSK: No TTP of the thoracic spine.  DRE: Normal prostate and sphincter tone. Stools brown, Hemoccult negative. No  Blood or mucus Skin: Not pale. Not jaundice Neurologic:  alert & oriented X3.  Speech normal, gait appropriate for age and unassisted Psych--  Cognition and judgment appear intact.  Cooperative with normal attention span and concentration.  Behavior appropriate. No anxious or depressed appearing.  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 Pain does have some MSK features. Recommend Flexeril at night and stretching. Change in the color of the stools, constipation: Labs as stated above. Recommend MiraLAX daily for 2 weeks then as needed. Probiotics. RTC 6 weeks. If not better will need further eval

## 2016-02-04 NOTE — Assessment & Plan Note (Signed)
Left sided flank, LLQ and back pain: Etiology not completely clear, will get a UA, urine culture, CMP and CBC. Pain does have some MSK features. Recommend Flexeril at night and stretching. Change in the color of the stools, constipation: Labs as stated above. Recommend MiraLAX daily for 2 weeks then as needed. Probiotics. RTC 6 weeks. If not better will need further eval

## 2016-02-05 LAB — URINE CULTURE
Colony Count: NO GROWTH
Organism ID, Bacteria: NO GROWTH

## 2016-03-17 ENCOUNTER — Ambulatory Visit (INDEPENDENT_AMBULATORY_CARE_PROVIDER_SITE_OTHER): Payer: BLUE CROSS/BLUE SHIELD | Admitting: Internal Medicine

## 2016-03-17 ENCOUNTER — Encounter: Payer: Self-pay | Admitting: Internal Medicine

## 2016-03-17 VITALS — BP 116/68 | HR 79 | Temp 98.0°F | Ht 75.0 in | Wt 176.6 lb

## 2016-03-17 DIAGNOSIS — R109 Unspecified abdominal pain: Secondary | ICD-10-CM

## 2016-03-17 DIAGNOSIS — H6690 Otitis media, unspecified, unspecified ear: Secondary | ICD-10-CM

## 2016-03-17 NOTE — Assessment & Plan Note (Signed)
Left side flank, LLQ and back pain: UA and labs were normal, sx essentially resolved. We'll keep him on Flexeril as needed will call for a refill Change in the color of stools:  resolved Frequent ear infections per patient, request a referral to Dr. Hassell Done in [facility]. We'll do RTC CPX 6 months

## 2016-03-17 NOTE — Progress Notes (Signed)
Pre visit review using our clinic review tool, if applicable. No additional management support is needed unless otherwise documented below in the visit note. 

## 2016-03-17 NOTE — Progress Notes (Signed)
   Subjective:    Patient ID: Michael Sampson, male    DOB: Oct 06, 1971, 45 y.o.   MRN: [id number]  DOS:  03/17/2016 Type of visit - description : Follow-up previous visit Interval history: Was seen with left abdominal and flank pain, symptoms essentially resolved, hardly ever has any ache or pain. Also, reports recurrent ear infections on the left, usually treated at the urgent care. Request ENT referral   Review of Systems  Denies any fever chills No rash No constipation or blood in the stools No difficulty urinating or gross hematuria  Past Medical History  Diagnosis Date  . Mitral valve prolapse     no SBE prophylaxis  . Hyperlipidemia   . Calculus of kidney 10/02/2014    Past Surgical History  Procedure Laterality Date  . Pectus carinatum      surgical correction at age 72  . Wisdom tooth extraction      Social History   Social History  . Marital Status: Single    Spouse Name: N/A  . Number of Children: 2  . Years of Education: N/A   Occupational History  . plant Chief Financial Officer     Social History Main Topics  . Smoking status: Never Smoker   . Smokeless tobacco: Never Used     Comment: + tobacco exposure   . Alcohol Use: No  . Drug Use: No  . Sexual Activity: Not on file   Other Topics Concern  . Not on file   Social History Narrative   Household-- pt , wife and 2 children        Medication List       This list is accurate as of: 03/17/16  4:59 PM.  Always use your most recent med list.               cyclobenzaprine 10 MG tablet  Commonly known as:  FLEXERIL  Take 1 tablet (10 mg total) by mouth at bedtime as needed for muscle spasms.           Objective:   Physical Exam BP 116/68 mmHg  Pulse 79  Temp(Src) 98 F (36.7 C) (Oral)  Ht 6\' 3"  (1.905 m)  Wt 176 lb 9.6 oz (80.105 kg)  BMI 22.07 kg/m2  SpO2 98% General:   Well developed, well nourished . NAD.  HEENT:  Normocephalic . Face symmetric, atraumatic. TMs normal, throat normal.    Lungs:  CTA B Normal respiratory effort, no intercostal retractions, no accessory muscle use. Heart: RRR,  no murmur.  no pretibial edema bilaterally  Abdomen:  Not distended, soft, non-tender. No rebound or rigidity. No CVA tenderness. Skin: Not pale. Not jaundice Neurologic:  alert & oriented X3.  Speech normal, gait appropriate for age and unassisted Psych--  Cognition and judgment appear intact.  Cooperative with normal attention span and concentration.  Behavior appropriate. No anxious or depressed appearing.    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 We'll keep him on Flexeril as needed will call for a refill Change in the color of stools:  resolved Frequent ear infections per patient, request a referral to Dr. Hassell Done in [facility]. We'll do RTC CPX 6 months

## 2016-10-13 ENCOUNTER — Encounter: Payer: Self-pay | Admitting: Internal Medicine

## 2017-01-19 ENCOUNTER — Encounter: Payer: Self-pay | Admitting: Internal Medicine

## 2017-01-26 ENCOUNTER — Ambulatory Visit (INDEPENDENT_AMBULATORY_CARE_PROVIDER_SITE_OTHER): Payer: BLUE CROSS/BLUE SHIELD | Admitting: Internal Medicine

## 2017-01-26 ENCOUNTER — Encounter: Payer: Self-pay | Admitting: Internal Medicine

## 2017-01-26 VITALS — BP 116/74 | HR 72 | Temp 97.6°F | Resp 14 | Ht 75.0 in | Wt 181.2 lb

## 2017-01-26 DIAGNOSIS — Z0000 Encounter for general adult medical examination without abnormal findings: Secondary | ICD-10-CM

## 2017-01-26 DIAGNOSIS — M2550 Pain in unspecified joint: Secondary | ICD-10-CM

## 2017-01-26 LAB — BASIC METABOLIC PANEL
BUN: 15 mg/dL (ref 6–23)
CO2: 30 mEq/L (ref 19–32)
CREATININE: 1.05 mg/dL (ref 0.40–1.50)
Calcium: 8.9 mg/dL (ref 8.4–10.5)
Chloride: 99 mEq/L (ref 96–112)
GFR: 81.52 mL/min (ref >60.00)
GLUCOSE: 91 mg/dL (ref 70–99)
POTASSIUM: 3.7 meq/L (ref 3.5–5.1)
Sodium: 135 mEq/L (ref 135–145)

## 2017-01-26 LAB — LIPID PANEL
CHOLESTEROL: 149 mg/dL (ref 0–200)
HDL: 45.7 mg/dL (ref >39.00)
LDL Cholesterol: 87 mg/dL (ref 0–99)
NONHDL: 103.67
Total CHOL/HDL Ratio: 3
Triglycerides: 81 mg/dL (ref 0.0–149.0)
VLDL: 16.2 mg/dL (ref 0.0–40.0)

## 2017-01-26 LAB — SEDIMENTATION RATE: SED RATE: 3 mm/h (ref 0–15)

## 2017-01-26 LAB — HIGH SENSITIVITY CRP: CRP HIGH SENSITIVITY: 36.25 mg/L — AB (ref 0.000–5.000)

## 2017-01-26 LAB — CK: Total CK: 49 U/L (ref 7–232)

## 2017-01-26 NOTE — Assessment & Plan Note (Addendum)
-  Td 2010; usually don't get flu shots -Colon cancer screening: Never had a colonoscopy , no family history -Prostate cancer screening: Not indicated -Labs- FLP BMP, sed rate, CKs, CRP Diet and exercise discussed

## 2017-01-26 NOTE — Progress Notes (Signed)
Pre visit review using our clinic review tool, if applicable. No additional management support is needed unless otherwise documented below in the visit note. 

## 2017-01-26 NOTE — Progress Notes (Signed)
Subjective:    Patient ID: Michael Sampson, male    DOB: 09/19/1971, 46 y.o.   MRN: [id number]  DOS:  01/26/2017 Type of visit - description : cpx Interval history: In general feeling well.   Review of Systems  Remains very active at work, he is a Pension scheme manager, constantly moving. Reports occasional aches and pains, mostly at the joints, different joints at different times. Does not recall to have hands or wrist swelling or redness. No headaches, weight loss, fever or chills. Also, had an ear infection few days ago, started Augmentin 2 days ago and feels better.  Other than above, a 14 point review of systems is negative     Past Medical History:  Diagnosis Date  . Calculus of kidney 10/02/2014  . Hyperlipidemia   . Mitral valve prolapse    no SBE prophylaxis    Past Surgical History:  Procedure Laterality Date  . pectus carinatum     surgical correction at age 36  . WISDOM TOOTH EXTRACTION      Social History   Social History  . Marital status: Married    Spouse name: N/A  . Number of children: 2  . Years of education: N/A   Occupational History  . plant Chief Financial Officer     Social History Main Topics  . Smoking status: Never Smoker  . Smokeless tobacco: Never Used     Comment: + tobacco exposure   . Alcohol use No  . Drug use: No  . Sexual activity: Not on file   Other Topics Concern  . Not on file   Social History Narrative   Household-- pt , wife and 2 children 2003, 2010     Family History  Problem Relation Age of Onset  . Hyperlipidemia Father   . COPD Father   . Heart murmur Mother   . Hyperthyroidism Mother     S/P RAI  . Graves' disease Sister   . Diabetes Neg Hx   . Stroke Neg Hx   . Cancer Neg Hx   . Heart disease Neg Hx      Allergies as of 01/26/2017   No Known Allergies     Medication List       Accurate as of 01/26/17 11:59 PM. Always use your most recent med list.          amoxicillin-clavulanate 250-125 MG  tablet Commonly known as:  AUGMENTIN Take 2 tablets by mouth 2 (two) times daily.          Objective:   Physical Exam BP 116/74 (BP Location: Left Arm, Patient Position: Sitting, Cuff Size: Normal)   Pulse 72   Temp 97.6 F (36.4 C) (Oral)   Resp 14   Ht 6\' 3"  (1.905 m)   Wt 181 lb 4 oz (82.2 kg)   SpO2 96%   BMI 22.65 kg/m   General:   Well developed, well nourished . NAD.  Neck: No  thyromegaly  HEENT:  Normocephalic . Face symmetric, atraumatic Lungs:  CTA B Normal respiratory effort, no intercostal retractions, no accessory muscle use. Heart: RRR,  no murmur.  No pretibial edema bilaterally  Abdomen:  Not distended, soft, non-tender. No rebound or rigidity.   Skin: Exposed areas without rash. Not pale. Not jaundice MSK: Hands and wrists without synovitis Neurologic:  alert & oriented X3.  Speech normal, gait appropriate for age and unassisted Strength symmetric and appropriate for age.  Psych: Cognition and judgment appear intact.  Cooperative with normal  attention span and concentration.  Behavior appropriate. No anxious or depressed appearing.    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 Arthralgias: Has on and off aches and pains, patient is somewhat concerned, he mentioned the issue several times during the visit, no synovitis or red flag symptoms. Will check a CRP, sedimentation rate and CK.(recent ear infection >> may increase inflammatory markers). RTC one year, CPX.

## 2017-01-26 NOTE — Patient Instructions (Addendum)
GO TO THE LAB : Get the blood work     GO TO THE FRONT DESK Schedule your next appointment for a  Physical in 1 year  

## 2017-01-28 NOTE — Assessment & Plan Note (Signed)
Hyperlipidemia: Reports she is doing well with diet and exercise, checking labs. Arthralgias: Has on and off aches and pains, patient is somewhat concerned, he mentioned the issue several times during the visit, no synovitis or red flag symptoms. Will check a CRP, sedimentation rate and CK.(recent ear infection >> may increase inflammatory markers). RTC one year, CPX.

## 2017-02-28 ENCOUNTER — Telehealth: Payer: Self-pay | Admitting: Internal Medicine

## 2017-02-28 NOTE — Telephone Encounter (Signed)
Patient scheduled with PCP 04/20/2017

## 2017-02-28 NOTE — Telephone Encounter (Signed)
Caller name:Camberos,Rory Relation to pt: mother  Call back number:[phone] / [phone]    Reason for call:  Patient states PCP would like patient to repeat labs, patient would like to schedule for Friday, chart doesn't reflect. Mother states patient labs came back abnormal and patient twin sister has the same concerns and wanted PCP to be aware, please advise

## 2017-02-28 NOTE — Telephone Encounter (Signed)
Pt needed 3 month follow-up in June 2018, labs will be completed at that time.

## 2017-04-20 ENCOUNTER — Ambulatory Visit (INDEPENDENT_AMBULATORY_CARE_PROVIDER_SITE_OTHER): Payer: BLUE CROSS/BLUE SHIELD | Admitting: Internal Medicine

## 2017-04-20 ENCOUNTER — Encounter: Payer: Self-pay | Admitting: Internal Medicine

## 2017-04-20 VITALS — BP 108/68 | HR 62 | Temp 98.2°F | Resp 14 | Ht 75.0 in | Wt 179.0 lb

## 2017-04-20 DIAGNOSIS — M545 Low back pain, unspecified: Secondary | ICD-10-CM

## 2017-04-20 DIAGNOSIS — G8929 Other chronic pain: Secondary | ICD-10-CM

## 2017-04-20 DIAGNOSIS — R300 Dysuria: Secondary | ICD-10-CM

## 2017-04-20 DIAGNOSIS — M2550 Pain in unspecified joint: Secondary | ICD-10-CM | POA: Diagnosis not present

## 2017-04-20 LAB — URINALYSIS, ROUTINE W REFLEX MICROSCOPIC
Bilirubin Urine: NEGATIVE
Hgb urine dipstick: NEGATIVE
KETONES UR: NEGATIVE
Leukocytes, UA: NEGATIVE
Nitrite: NEGATIVE
PH: 6 (ref 5.0–8.0)
RBC / HPF: NONE SEEN (ref 0–?)
TOTAL PROTEIN, URINE-UPE24: NEGATIVE
URINE GLUCOSE: NEGATIVE
UROBILINOGEN UA: 0.2 (ref 0.0–1.0)

## 2017-04-20 LAB — SEDIMENTATION RATE: Sed Rate: 1 mm/hr (ref 0–15)

## 2017-04-20 LAB — HIGH SENSITIVITY CRP: CRP, High Sensitivity: 0.88 mg/L (ref 0.000–5.000)

## 2017-04-20 NOTE — Progress Notes (Signed)
Pre visit review using our clinic review tool, if applicable. No additional management support is needed unless otherwise documented below in the visit note. 

## 2017-04-20 NOTE — Progress Notes (Signed)
   Subjective:    Patient ID: Michael Sampson, male    DOB: 04-05-1971, 46 y.o.   MRN: [id number]  DOS:  04/20/2017 Type of visit - description : f/u Interval history: Here to f/u on abnormal labs,  CRP was elevated. He continue with aches and pains, different joints  at different times. Also lately, has low back pain, mostly in the daytime if he is standing too much. No nocturnal back pain. Also, wife complaining of his urine "smells really bad". Urine wnl to patient.   Review of Systems Denies fever chills or weight loss. No headache No lower extremity paresthesias, no bladder or bowel incontinence. Denies any redness or puffiness of the hands and wrists. No   gross hematuria or difficulty urinating. Occasionally has mild burning with urination, symptoms are not consistent.  Past Medical History:  Diagnosis Date  . Calculus of kidney 10/02/2014  . Hyperlipidemia   . Mitral valve prolapse    no SBE prophylaxis    Past Surgical History:  Procedure Laterality Date  . pectus carinatum     surgical correction at age 89  . WISDOM TOOTH EXTRACTION      Social History   Social History  . Marital status: Married    Spouse name: N/A  . Number of children: 2  . Years of education: N/A   Occupational History  . plant Chief Financial Officer     Social History Main Topics  . Smoking status: Never Smoker  . Smokeless tobacco: Never Used     Comment: + tobacco exposure   . Alcohol use No  . Drug use: No  . Sexual activity: Not on file   Other Topics Concern  . Not on file   Social History Narrative   Household-- pt , wife and 2 children 2003, 2010      Allergies as of 04/20/2017   No Known Allergies     Medication List    as of 04/20/2017 11:59 PM   You have not been prescribed any medications.        Objective:   Physical Exam BP 108/68 (BP Location: Left Arm, Patient Position: Sitting, Cuff Size: Small)   Pulse 62   Temp 98.2 F (36.8 C) (Oral)   Resp 14   Ht 6\' 3"   (1.905 m)   Wt 179 lb (81.2 kg)   SpO2 98%   BMI 22.37 kg/m  General:   Well developed, well nourished . NAD.  HEENT:  Normocephalic . Face symmetric, atraumatic MSK: No TTP at the lumbar spine or SI joints. Hands and wrists without synovitis Skin: Not pale. Not jaundice Neurologic:  alert & oriented X3.  Speech normal, gait appropriate for age and unassisted. Motor symmetric. Psych--  Cognition and judgment appear intact.  Cooperative with normal attention span and concentration.  Behavior appropriate. No anxious or depressed appearing.      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 ROS unrevealing. Last CRP elevated. CK was normal. Plan: Repeat a CRP, sedimentation rate, RA, rheumatoid factor. X-ray back. Consider rheumatology or ortho referral. Occasional dysuria: See above, occasional dysuria, urine "smells bad". Check a UA urine culture, G&C. Also HIV  (test triggered by Epic b/c we are checking a G&C). RTC depending on results

## 2017-04-21 LAB — URINE CULTURE: Organism ID, Bacteria: NO GROWTH

## 2017-04-21 LAB — HIV ANTIBODY (ROUTINE TESTING W REFLEX): HIV: NONREACTIVE

## 2017-04-22 NOTE — Assessment & Plan Note (Signed)
Arthralgias: Continue w/ on and off arthralgias and here lately more consistent low back pain. ROS unrevealing. Last CRP elevated. CK was normal. Plan: Repeat a CRP, sedimentation rate, RA, rheumatoid factor. X-ray back. Consider rheumatology or ortho referral. Occasional dysuria: See above, occasional dysuria, urine "smells bad". Check a UA urine culture, G&C. Also HIV  (test triggered by Epic b/c we are checking a G&C). RTC depending on results

## 2017-04-23 LAB — ANA: ANA: NEGATIVE

## 2017-04-24 LAB — RHEUMATOID FACTOR

## 2017-04-25 LAB — GC/CHLAMYDIA PROBE AMP
CT PROBE, AMP APTIMA: NOT DETECTED
GC Probe RNA: NOT DETECTED

## 2017-09-08 ENCOUNTER — Encounter (HOSPITAL_BASED_OUTPATIENT_CLINIC_OR_DEPARTMENT_OTHER): Payer: Self-pay | Admitting: Emergency Medicine

## 2017-09-08 ENCOUNTER — Emergency Department (HOSPITAL_BASED_OUTPATIENT_CLINIC_OR_DEPARTMENT_OTHER)
Admission: EM | Admit: 2017-09-08 | Discharge: 2017-09-08 | Disposition: A | Discharge status: Home / Self Care | Payer: BLUE CROSS/BLUE SHIELD | Attending: Emergency Medicine | Admitting: Emergency Medicine

## 2017-09-08 DIAGNOSIS — Y998 Other external cause status: Secondary | ICD-10-CM | POA: Insufficient documentation

## 2017-09-08 DIAGNOSIS — S61227A Laceration with foreign body of left little finger without damage to nail, initial encounter: Secondary | ICD-10-CM | POA: Diagnosis present

## 2017-09-08 DIAGNOSIS — E785 Hyperlipidemia, unspecified: Secondary | ICD-10-CM | POA: Diagnosis not present

## 2017-09-08 DIAGNOSIS — W01198A Fall on same level from slipping, tripping and stumbling with subsequent striking against other object, initial encounter: Secondary | ICD-10-CM | POA: Diagnosis not present

## 2017-09-08 DIAGNOSIS — Z23 Encounter for immunization: Secondary | ICD-10-CM | POA: Diagnosis not present

## 2017-09-08 DIAGNOSIS — Y929 Unspecified place or not applicable: Secondary | ICD-10-CM | POA: Insufficient documentation

## 2017-09-08 DIAGNOSIS — S60457A Superficial foreign body of left little finger, initial encounter: Secondary | ICD-10-CM

## 2017-09-08 DIAGNOSIS — Y939 Activity, unspecified: Secondary | ICD-10-CM | POA: Diagnosis not present

## 2017-09-08 MED ORDER — BUPIVACAINE HCL (PF) 0.5 % IJ SOLN
5.0000 mL | Freq: Once | INTRAMUSCULAR | Status: AC
  Administered 2017-09-08: 5 mL
  Filled 2017-09-08: qty 10

## 2017-09-08 MED ORDER — TETANUS-DIPHTH-ACELL PERTUSSIS 5-2.5-18.5 LF-MCG/0.5 IM SUSP
0.5000 mL | Freq: Once | INTRAMUSCULAR | Status: AC
  Administered 2017-09-08: 0.5 mL via INTRAMUSCULAR
  Filled 2017-09-08: qty 0.5

## 2017-09-08 NOTE — ED Provider Notes (Signed)
[facility] EMERGENCY DEPARTMENT Provider Note   CSN: [id number] Arrival date & time: 09/08/17  1451     History   Chief Complaint Chief Complaint  Patient presents with  . Finger Injury    HPI Michael Sampson is a 46 y.o. male.  HPI  46yM with FB to L little finger. Pt was working on a metal sprocket last Thursday when he felt some pain in this finger. He was wearing leather gloves. He saw a small cut in them and then noted a small wound when he took them off. He washed the wound out and it seemed like it was healing well until the past couple days. Some pain, redness and mild swelling. Went to Urgent Care and had x-rays. FB noted and referred to the ED. They prescribed him 7d of bactrim DS BID but hasn't filled it yet.   Past Medical History:  Diagnosis Date  . Calculus of kidney 10/02/2014  . Hyperlipidemia   . Mitral valve prolapse    no SBE prophylaxis    Patient Active Problem List   Diagnosis Date Noted  . PCP NOTES >>>>>>>>>>>>>>>>>>>>>> 02/04/2016  . Annual physical exam 07/09/2015  . Calculus of kidney 10/02/2014  . Dyspnea 09/21/2013  . Other and unspecified hyperlipidemia 07/08/2011  . MITRAL VALVE PROLAPSE 08/07/2008    Past Surgical History:  Procedure Laterality Date  . pectus carinatum     surgical correction at age 52  . WISDOM TOOTH EXTRACTION         Home Medications    Prior to Admission medications   Not on File    Family History Family History  Problem Relation Age of Onset  . Hyperlipidemia Father   . COPD Father   . Heart murmur Mother   . Hyperthyroidism Mother        S/P RAI  . Graves' disease Sister   . Diabetes Neg Hx   . Stroke Neg Hx   . Cancer Neg Hx   . Heart disease Neg Hx     Social History Social History  Substance Use Topics  . Smoking status: Never Smoker  . Smokeless tobacco: Never Used     Comment: + tobacco exposure   . Alcohol use No     Allergies   Patient has no known  allergies.   Review of Systems Review of Systems  All systems reviewed and negative, other than as noted in HPI.   Physical Exam Updated Vital Signs BP (!) 197/84 (BP Location: Left Arm)   Pulse 88   Temp 97.9 F (36.6 C) (Oral)   Resp 18   Ht 6\' 3"  (1.905 m)   Wt 81.6 kg (180 lb)   SpO2 100%   BMI 22.50 kg/m   Physical Exam  Constitutional: He appears well-developed and well-nourished. No distress.  HENT:  Head: Normocephalic and atraumatic.  Eyes: Conjunctivae are normal. Right eye exhibits no discharge. Left eye exhibits no discharge.  Neck: Neck supple.  Cardiovascular: Normal rate, regular rhythm and normal heart sounds.  Exam reveals no gallop and no friction rub.   No murmur heard. Pulmonary/Chest: Effort normal and breath sounds normal. No respiratory distress.  Abdominal: Soft. He exhibits no distension. There is no tenderness.  Musculoskeletal: He exhibits no edema or tenderness.  Neurological: He is alert.  Skin: Skin is warm and dry.  Healing wound to dorsal R little finger over distal phalanx. Slightly proximal to this there is mild swelling, TTP and erythema. He can  fully actively extend DIP against resistance. NVI.   Psychiatric: He has a normal mood and affect. His behavior is normal. Thought content normal.  Nursing note and vitals reviewed.    ED Treatments / Results  Labs (all labs ordered are listed, but only abnormal results are displayed) Labs Reviewed - No data to display  EKG  EKG Interpretation None       Radiology No results found.  Procedures .Foreign Body Removal Date/Time: 09/08/2017 4:00 PM Performed by: Virgel Manifold Authorized by: Virgel Manifold  Consent: Verbal consent obtained. Risks and benefits: risks, benefits and alternatives were discussed (risks not limited to pain, bleeding, infection, NVI injury, tendon injury, etc. alternatives: antibiotics and observation, hand surgery follow-up, etc) Consent given by:  patient Patient identity confirmed: verbally with patient and provided demographic data Body area: skin General location: upper extremity Location details: left small finger Anesthesia: local infiltration and digital block  Anesthesia: Local Anesthetic: bupivacaine 0.5% without epinephrine Anesthetic total: 3 mL  Sedation: Patient sedated: no Patient restrained: no Patient cooperative: yes Localization method: probed and ultrasound Removal mechanism: forceps Dressing: antibiotic ointment and dressing applied Depth: subcutaneous Complexity: complex Objects recovered: 1 metallic FB removed the size and shape as noted on XR and [place]. No additional FB noted post-procedure.  Post-procedure assessment: foreign body removed Patient tolerance: Patient tolerated the procedure well with no immediate complications Comments: 2cc marcaine for digital block and then 1cc locally infiltrated. FB located by bedside US, ~0.25 cm from skin surface. Less than 1cm incision made with 11 blade directly over it. It was easily grasped and removed. Appeared intact. Placed in specimen jar and given to patient at his request. No pus noted, but wound left open. Minimal bleeding.    (including critical care time)  Medications Ordered in ED Medications  bupivacaine (MARCAINE) 0.5 % injection 5 mL (5 mLs Infiltration Given by Other 09/08/17 1522)  Tdap (BOOSTRIX) injection 0.5 mL (0.5 mLs Intramuscular Given 09/08/17 1643)     Initial Impression / Assessment and Plan / ED Course  I have reviewed the triage vital signs and the nursing notes.  Pertinent labs & imaging results that were available during my care of the patient were reviewed by me and considered in my medical decision making (see chart for details).     46yM with FB L little finger. Infected versus FB reaction. Imaging from UC reviewed. FB about the size of a grain of rice proximal/radial to site of his laceration. Doesn't appear to involve joint  based on both imaging and exam.   Localized on bedside US. ~1/4cm deep. Removed easily. Roughly the size/shape of a grain of rice. Incision less than 1 cm.  Left open. Continued wound care dicussed. Prescribed 7d of bactrim by UC. Advised to fill and finished. Tetanus updated. Return precautions discussed.    Final Clinical Impressions(s) / ED Diagnoses   Final diagnoses:  Foreign body of left little finger    New Prescriptions New Prescriptions   No medications on file     Virgel Manifold, MD 09/08/17 1650

## 2017-09-08 NOTE — Discharge Instructions (Signed)
Fill and finish bactrim(antibiotic) as prescribed at Urgent Care.

## 2017-09-08 NOTE — ED Notes (Signed)
Pt given d/c instructions as per chart. Verbalizes understanding. No questions. 

## 2017-09-08 NOTE — ED Triage Notes (Signed)
Pt cut L pinky 1 week ago. Pt states he cleaned and covered it but has had swelling and pain over the last week. Went to UC today and xray showed foreign body in pinky.

## 2018-08-09 ENCOUNTER — Encounter: Payer: Self-pay | Admitting: Internal Medicine

## 2018-08-09 ENCOUNTER — Ambulatory Visit (INDEPENDENT_AMBULATORY_CARE_PROVIDER_SITE_OTHER): Payer: BLUE CROSS/BLUE SHIELD | Admitting: Internal Medicine

## 2018-08-09 VITALS — BP 116/74 | HR 64 | Temp 97.5°F | Resp 16 | Ht 75.0 in | Wt 179.1 lb

## 2018-08-09 DIAGNOSIS — Z8249 Family history of ischemic heart disease and other diseases of the circulatory system: Secondary | ICD-10-CM

## 2018-08-09 DIAGNOSIS — W57XXXS Bitten or stung by nonvenomous insect and other nonvenomous arthropods, sequela: Secondary | ICD-10-CM

## 2018-08-09 DIAGNOSIS — Z0000 Encounter for general adult medical examination without abnormal findings: Secondary | ICD-10-CM

## 2018-08-09 DIAGNOSIS — Z23 Encounter for immunization: Secondary | ICD-10-CM

## 2018-08-09 DIAGNOSIS — Z1379 Encounter for other screening for genetic and chromosomal anomalies: Secondary | ICD-10-CM

## 2018-08-09 DIAGNOSIS — Z7722 Contact with and (suspected) exposure to environmental tobacco smoke (acute) (chronic): Secondary | ICD-10-CM

## 2018-08-09 DIAGNOSIS — Z1388 Encounter for screening for disorder due to exposure to contaminants: Secondary | ICD-10-CM

## 2018-08-09 LAB — CBC WITH DIFFERENTIAL/PLATELET
BASOS PCT: 0.7 % (ref 0.0–3.0)
Basophils Absolute: 0 10*3/uL (ref 0.0–0.1)
EOS PCT: 1.5 % (ref 0.0–5.0)
Eosinophils Absolute: 0.1 10*3/uL (ref 0.0–0.7)
HCT: 44.3 % (ref 39.0–52.0)
Hemoglobin: 15.3 g/dL (ref 13.0–17.0)
Lymphocytes Relative: 29.4 % (ref 12.0–46.0)
Lymphs Abs: 1.5 10*3/uL (ref 0.7–4.0)
MCHC: 34.5 g/dL (ref 30.0–36.0)
MCV: 84.6 fl (ref 78.0–100.0)
MONO ABS: 0.3 10*3/uL (ref 0.1–1.0)
Monocytes Relative: 6.6 % (ref 3.0–12.0)
NEUTROS ABS: 3.1 10*3/uL (ref 1.4–7.7)
NEUTROS PCT: 61.8 % (ref 43.0–77.0)
PLATELETS: 253 10*3/uL (ref 150.0–400.0)
RBC: 5.24 Mil/uL (ref 4.22–5.81)
RDW: 14 % (ref 11.5–15.5)
WBC: 5 10*3/uL (ref 4.0–10.5)

## 2018-08-09 LAB — COMPREHENSIVE METABOLIC PANEL
ALBUMIN: 4.4 g/dL (ref 3.5–5.2)
ALK PHOS: 39 U/L (ref 39–117)
ALT: 13 U/L (ref 0–53)
AST: 12 U/L (ref 0–37)
BUN: 19 mg/dL (ref 6–23)
CALCIUM: 9.2 mg/dL (ref 8.4–10.5)
CHLORIDE: 103 meq/L (ref 96–112)
CO2: 32 mEq/L (ref 19–32)
CREATININE: 1.03 mg/dL (ref 0.40–1.50)
GFR: 82.77 mL/min (ref >60.00)
Glucose, Bld: 91 mg/dL (ref 70–99)
POTASSIUM: 4.2 meq/L (ref 3.5–5.1)
Sodium: 140 mEq/L (ref 135–145)
Total Bilirubin: 0.7 mg/dL (ref 0.2–1.2)
Total Protein: 6.9 g/dL (ref 6.0–8.3)

## 2018-08-09 LAB — LIPID PANEL
CHOLESTEROL: 158 mg/dL (ref 0–200)
HDL: 42.1 mg/dL (ref >39.00)
LDL CALC: 104 mg/dL — AB (ref 0–99)
NonHDL: 115.7
TRIGLYCERIDES: 59 mg/dL (ref 0.0–149.0)
Total CHOL/HDL Ratio: 4
VLDL: 11.8 mg/dL (ref 0.0–40.0)

## 2018-08-09 LAB — TSH: TSH: 1.62 u[IU]/mL (ref 0.35–4.50)

## 2018-08-09 LAB — PSA: PSA: 1.09 ng/mL (ref 0.10–4.00)

## 2018-08-09 NOTE — Progress Notes (Signed)
Subjective:    Patient ID: Michael Sampson, male    DOB: 01-29-1971, 47 y.o.   MRN: [id number]  DOS:  08/09/2018 Type of visit - description : cpx Interval history: Here for a CPX, jas multiple concerns and requests    Review of Systems Back in the spring, he saw a tic on him, reportedly engorged, no rash. He has chronic joint complaints but since then he feels pain is a little more prominent, mostly on the hands, knees, ankles and wrists. Denies any puffiness/redness/swelling in any joint. No fever chills or headaches Mild fatigue.  Other than above, a 14 point review of systems is negative     Past Medical History:  Diagnosis Date  . Calculus of kidney 10/02/2014  . Hyperlipidemia   . Mitral valve prolapse    no SBE prophylaxis    Past Surgical History:  Procedure Laterality Date  . 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 WISDOM TOOTH EXTRACTION      Social History   Socioeconomic History  . Marital status: Married    Spouse name: Not on file  . Number of children: 2  . Years of education: Not on file  . Highest education level: Not on file  Occupational History  . Occupation: Pension scheme manager   Social Needs  . Financial resource strain: Not on file  . Food insecurity:    Worry: Not on file    Inability: Not on file  . Transportation needs:    Medical: Not on file    Non-medical: Not on file  Tobacco Use  . Smoking status: Never Smoker  . Smokeless tobacco: Never Used  . Tobacco comment: + tobacco exposure   Substance and Sexual Activity  . Alcohol use: No    Alcohol/week: 0.0 standard drinks  . Drug use: No  . Sexual activity: Not on file  Lifestyle  . Physical activity:    Days per week: Not on file    Minutes per session: Not on file  . Stress: Not on file  Relationships  . Social connections:    Talks on phone: Not on file    Gets together: Not on file    Attends religious service: Not on file    Active member of club or  organization: Not on file    Attends meetings of clubs or organizations: Not on file    Relationship status: Not on file  . Intimate partner violence:    Fear of current or ex partner: Not on file    Emotionally abused: Not on file    Physically abused: Not on file    Forced sexual activity: Not on file  Other Topics Concern  . Not on file  Social History Narrative   Household-- pt , wife and 2 children 2003, 2010     Family History  Problem Relation Age of Onset  . Hyperlipidemia Father   . COPD Father   . Aortic dissection Father   . Heart murmur Mother   . Hyperthyroidism Mother        S/P RAI  . Graves' disease Sister   . Diabetes Neg Hx   . Stroke Neg Hx   . Cancer Neg Hx   . Heart disease Neg Hx      Allergies as of 08/09/2018   No Known Allergies     Medication List    as of 08/09/2018 11:59 PM   You have not been prescribed any  medications.        Objective:   Physical Exam BP 116/74 (BP Location: Left Arm, Patient Position: Sitting, Cuff Size: Small)   Pulse 64   Temp (!) 97.5 F (36.4 C) (Oral)   Resp 16   Ht 6\' 3"  (1.905 m)   Wt 179 lb 2 oz (81.3 kg)   SpO2 96%   BMI 22.39 kg/m  General: Well developed, NAD, see BMI.  Neck: No  thyromegaly  HEENT:  Normocephalic . Face symmetric, atraumatic Lungs:  CTA B Normal respiratory effort, no intercostal retractions, no accessory muscle use. Heart: RRR,  no murmur.  No pretibial edema bilaterally  Abdomen:  Not distended, soft, non-tender. No rebound or rigidity.   Skin: Exposed areas without rash. Not pale. Not jaundice DRE: Normal prostate.  No stools found. Neurologic:  alert & oriented X3.  Speech normal, gait appropriate for age and unassisted Strength symmetric and appropriate for age.  Psych: Cognition and judgment appear intact.  Cooperative with normal attention span and concentration.  Behavior appropriate. No anxious or depressed appearing.     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  Rheumatoid factor negative.  At this point he is concerned about Lyme's Dz, unclear to me if that is the case, we will check Lyme serology. Lead exposure: Works in [employer], request screen. RTC 1 year

## 2018-08-09 NOTE — Assessment & Plan Note (Addendum)
-  Td 2010 ; flu shot today -Colon cancer screening: Never had a colonoscopy , no family history -Prostate cancer screening: Patient requested screening, DRE normal, check a PSA. -Request screening for aortic aneurysms and lung cancer, he never smoked but has been exposed., his father does have a thoracic aortic aneurysm. Plan: Chest CT for lung cancer screening (will visualize the thoracic aorta) and a US of the abdominal aorta. -Requests "genetic testing" d/t FH Marphan's, will refer at his request --I'm not sure how many things he requested we will be able to achieve (states is"all that is covered by my insurance"). -Labs-CMP FLP CBC TSH PSA Lyme serology, lead levels. -- Diet and exercise discussed

## 2018-08-09 NOTE — Progress Notes (Signed)
Pre visit review using our clinic review tool, if applicable. No additional management support is needed unless otherwise documented below in the visit note. 

## 2018-08-09 NOTE — Patient Instructions (Signed)
GO TO THE LAB : Get the blood work     GO TO THE FRONT DESK Schedule your next appointment for a  Physical in 1 year  We will try to schedule CT of your chest and a aorta ultrasound

## 2018-08-11 NOTE — Assessment & Plan Note (Signed)
Arthralgias:  Continue with arthralgias, see previous entries, inflammatory markers were negative.  Rheumatoid factor negative.  At this point he is concerned about Lyme's Dz, unclear to me if that is the case, we will check Lyme serology. Lead exposure: Works in [employer], request screen. RTC 1 year

## 2018-08-12 LAB — HEAVY METALS PANEL, BLOOD: Lead: 1 ug/dL (ref <5)

## 2018-08-12 LAB — B. BURGDORFI ANTIBODIES: B burgdorferi Ab IgG+IgM: <0.9 index

## 2018-08-27 ENCOUNTER — Other Ambulatory Visit: Payer: Self-pay

## 2018-08-27 DIAGNOSIS — Z1379 Encounter for other screening for genetic and chromosomal anomalies: Secondary | ICD-10-CM

## 2018-08-29 ENCOUNTER — Telehealth: Payer: Self-pay | Admitting: Internal Medicine

## 2018-08-29 NOTE — Telephone Encounter (Signed)
Looks like CT chest was already authorized through insurance.

## 2018-08-29 NOTE — Telephone Encounter (Signed)
Patient was not eligible for a lung ca screening  CT  My staff was able to schedule a CT chest without contrast which is ok  Please advise patient of above;  he needs to check  with his insurance before the procedure and figure out his cost as it may be very expensive

## 2018-09-13 ENCOUNTER — Ambulatory Visit (HOSPITAL_BASED_OUTPATIENT_CLINIC_OR_DEPARTMENT_OTHER): Payer: BLUE CROSS/BLUE SHIELD

## 2018-09-20 ENCOUNTER — Ambulatory Visit (HOSPITAL_BASED_OUTPATIENT_CLINIC_OR_DEPARTMENT_OTHER): Payer: BLUE CROSS/BLUE SHIELD

## 2018-09-20 ENCOUNTER — Ambulatory Visit (HOSPITAL_BASED_OUTPATIENT_CLINIC_OR_DEPARTMENT_OTHER)
Admission: RE | Admit: 2018-09-20 | Discharge: 2018-09-20 | Disposition: A | Discharge status: Home / Self Care | Payer: BLUE CROSS/BLUE SHIELD | Source: Ambulatory Visit | Attending: Internal Medicine | Admitting: Internal Medicine

## 2018-09-20 DIAGNOSIS — Z8249 Family history of ischemic heart disease and other diseases of the circulatory system: Secondary | ICD-10-CM | POA: Diagnosis present

## 2018-12-17 ENCOUNTER — Ambulatory Visit: Payer: Self-pay | Admitting: *Deleted

## 2018-12-17 MED ORDER — OSELTAMIVIR PHOSPHATE 75 MG PO CAPS: 75.0000 mg | ORAL_CAPSULE | Freq: Every day | ORAL | 0 refills | Status: DC

## 2018-12-17 NOTE — Telephone Encounter (Signed)
Please advise 

## 2018-12-17 NOTE — Telephone Encounter (Signed)
Tried calling Pt- no answer. LMOM for Pt's wife Amber informing Tamiflu has been sent to [facility] on [address].

## 2018-12-17 NOTE — Telephone Encounter (Signed)
Summary: tamiflu request   Pts wife calling to see if preventative tamiflu can be sent in for her husband as son has tested positive for flu B.     Left message to call back for triage information collection

## 2018-12-17 NOTE — Addendum Note (Signed)
Addended byDamita Dunnings D on: 12/17/2018 12:59 PM   Modules accepted: Orders

## 2018-12-17 NOTE — Telephone Encounter (Signed)
Okay to send a prescription to [place]. Tamiflu 75 mg 1 p.o. daily #10 no refills.

## 2018-12-17 NOTE — Telephone Encounter (Signed)
Patient's wife is requesting Tamiflu- the other family members are being treated- son is high risk and she does not want him to get flu back. Requesting treatment. Reason for Disposition . [1] Influenza EXPOSURE within last 48 hours (2 days) AND [2] NOT HIGH RISK AND [3] strongly requests antiviral medication  Answer Assessment - Initial Assessment Questions 1. TYPE of EXPOSURE: "How were you exposed?" (e.g., close contact, not a close contact)     Close contact- son 2. DATE of EXPOSURE: "When did the exposure occur?" (e.g., hour, days, weeks)     Household exposure/eating after each other, child tested positive- today 3. PREGNANCY: "Is there any chance you are pregnant?" "When was your last menstrual period?"     n/a 4. HIGH RISK for COMPLICATIONS: "Do you have any heart or lung problems? Do you have a weakened immune system?" (e.g., CHF, COPD, asthma, HIV positive, chemotherapy, renal failure, diabetes mellitus, sickle cell anemia)     no 5. SYMPTOMS: "Do you have any symptoms?" (e.g., cough, fever, sore throat, difficulty breathing).     congestion  Protocols used: INFLUENZA EXPOSURE-A-AH

## 2019-06-09 ENCOUNTER — Encounter: Payer: Self-pay | Admitting: Internal Medicine

## 2019-10-23 ENCOUNTER — Other Ambulatory Visit: Payer: Self-pay

## 2019-10-24 ENCOUNTER — Encounter: Payer: Self-pay | Admitting: Internal Medicine

## 2019-10-24 ENCOUNTER — Other Ambulatory Visit: Payer: Self-pay

## 2019-10-24 ENCOUNTER — Ambulatory Visit (INDEPENDENT_AMBULATORY_CARE_PROVIDER_SITE_OTHER): Payer: BC Managed Care – PPO | Admitting: Internal Medicine

## 2019-10-24 VITALS — BP 123/81 | HR 75 | Temp 97.2°F | Resp 16 | Ht 75.0 in | Wt 189.2 lb

## 2019-10-24 DIAGNOSIS — Z0000 Encounter for general adult medical examination without abnormal findings: Secondary | ICD-10-CM | POA: Diagnosis not present

## 2019-10-24 LAB — LIPID PANEL
Cholesterol: 179 mg/dL (ref 0–200)
HDL: 45 mg/dL (ref >39.00)
LDL Cholesterol: 115 mg/dL — ABNORMAL HIGH (ref 0–99)
NonHDL: 133.82
Total CHOL/HDL Ratio: 4
Triglycerides: 96 mg/dL (ref 0.0–149.0)
VLDL: 19.2 mg/dL (ref 0.0–40.0)

## 2019-10-24 LAB — CBC WITH DIFFERENTIAL/PLATELET
Basophils Absolute: 0 10*3/uL (ref 0.0–0.1)
Basophils Relative: 0.8 % (ref 0.0–3.0)
Eosinophils Absolute: 0.1 10*3/uL (ref 0.0–0.7)
Eosinophils Relative: 2.3 % (ref 0.0–5.0)
HCT: 45.7 % (ref 39.0–52.0)
Hemoglobin: 15.2 g/dL (ref 13.0–17.0)
Lymphocytes Relative: 31.5 % (ref 12.0–46.0)
Lymphs Abs: 1.6 10*3/uL (ref 0.7–4.0)
MCHC: 33.2 g/dL (ref 30.0–36.0)
MCV: 86 fl (ref 78.0–100.0)
Monocytes Absolute: 0.4 10*3/uL (ref 0.1–1.0)
Monocytes Relative: 7.6 % (ref 3.0–12.0)
Neutro Abs: 2.9 10*3/uL (ref 1.4–7.7)
Neutrophils Relative %: 57.8 % (ref 43.0–77.0)
Platelets: 265 10*3/uL (ref 150.0–400.0)
RBC: 5.32 Mil/uL (ref 4.22–5.81)
RDW: 13.8 % (ref 11.5–15.5)
WBC: 4.9 10*3/uL (ref 4.0–10.5)

## 2019-10-24 LAB — COMPREHENSIVE METABOLIC PANEL
ALT: 12 U/L (ref 0–53)
AST: 13 U/L (ref 0–37)
Albumin: 4.4 g/dL (ref 3.5–5.2)
Alkaline Phosphatase: 44 U/L (ref 39–117)
BUN: 15 mg/dL (ref 6–23)
CO2: 30 mEq/L (ref 19–32)
Calcium: 9 mg/dL (ref 8.4–10.5)
Chloride: 103 mEq/L (ref 96–112)
Creatinine, Ser: 1.03 mg/dL (ref 0.40–1.50)
GFR: 77.47 mL/min (ref >60.00)
Glucose, Bld: 99 mg/dL (ref 70–99)
Potassium: 4.7 mEq/L (ref 3.5–5.1)
Sodium: 138 mEq/L (ref 135–145)
Total Bilirubin: 1 mg/dL (ref 0.2–1.2)
Total Protein: 6.9 g/dL (ref 6.0–8.3)

## 2019-10-24 MED ORDER — SILDENAFIL CITRATE 20 MG PO TABS: 60.0000 mg | ORAL_TABLET | Freq: Every evening | ORAL | 3 refills | Status: DC | PRN

## 2019-10-24 NOTE — Patient Instructions (Signed)
GO TO THE LAB : Get the blood work     GO TO THE FRONT DESK Schedule your next appointment   for a physical exam in 1 year 

## 2019-10-24 NOTE — Progress Notes (Signed)
Pre visit review using our clinic review tool, if applicable. No additional management support is needed unless otherwise documented below in the visit note. 

## 2019-10-24 NOTE — Progress Notes (Signed)
Subjective:    Patient ID: Michael Sampson, male    DOB: [birth year], 49 y.o.   MRN: [id number]  DOS:  10/24/2019 Type of visit - description: CPX Other than normal aches and pains he feels well.  Aches and pains are not worse. Occasionally difficulty with erections, denies chest pain, claudication, shortness of breath or lower extremity edema.  Would like to explore treatment    Review of Systems  Other than above, a 14 point review of systems is negative    Past Medical History:  Diagnosis Date  . Calculus of kidney 10/02/2014  . Hyperlipidemia   . Mitral valve prolapse    no SBE prophylaxis    Past Surgical History:  Procedure Laterality Date  . pectus carinatum     surgical correction at age 64  . WISDOM TOOTH EXTRACTION      Social History   Socioeconomic History  . Marital status: Married    Spouse name: Not on file  . Number of children: 2  . Years of education: Not on file  . Highest education level: Not on file  Occupational History  . Occupation: Pension scheme manager   Tobacco Use  . Smoking status: Never Smoker  . Smokeless tobacco: Never Used  . Tobacco comment: + tobacco exposure   Substance and Sexual Activity  . Alcohol use: No    Alcohol/week: 0.0 standard drinks  . Drug use: No  . Sexual activity: Not on file  Other Topics Concern  . Not on file  Social History Narrative   Household-- pt , wife and 2 children 2003, 2010   Social Determinants of Health   Financial Resource Strain:   . Difficulty of Paying Living Expenses: Not on file  Food Insecurity:   . Worried About Charity fundraiser in the Last Year: Not on file  . Ran Out of Food in the Last Year: Not on file  Transportation Needs:   . Lack of Transportation (Medical): Not on file  . Lack of Transportation (Non-Medical): Not on file  Physical Activity:   . Days of Exercise per Week: Not on file  . Minutes of Exercise per Session: Not on file  Stress:   . Feeling of Stress : Not on  file  Social Connections:   . Frequency of Communication with Friends and Family: Not on file  . Frequency of Social Gatherings with Friends and Family: Not on file  . Attends Religious Services: Not on file  . Active Member of Clubs or Organizations: Not on file  . Attends Archivist Meetings: Not on file  . Marital Status: Not on file  Intimate Partner Violence:   . Fear of Current or Ex-Partner: Not on file  . Emotionally Abused: Not on file  . Physically Abused: Not on file  . Sexually Abused: Not on file     Family History  Problem Relation Age of Onset  . Hyperlipidemia Father   . COPD Father   . Aortic dissection Father   . Heart murmur Mother   . Hyperthyroidism Mother        S/P RAI  . Graves' disease Sister   . Diabetes Neg Hx   . Stroke Neg Hx   . Cancer Neg Hx   . Heart disease Neg Hx     Allergies as of 10/24/2019   No Known Allergies     Medication List       Accurate as of October 24, 2019 11:59 PM.  If you have any questions, ask your nurse or doctor.        STOP taking these medications   oseltamivir 75 MG capsule Commonly known as: Tamiflu Stopped by: Kathlene November, MD     TAKE these medications   multivitamin capsule Take 1 capsule by mouth daily.   sildenafil 20 MG tablet Commonly known as: REVATIO Take 3-4 tablets (60-80 mg total) by mouth at bedtime as needed. Started by: Kathlene November, MD           Objective:   Physical Exam BP 123/81 (BP Location: Left Arm, Patient Position: Sitting, Cuff Size: Normal)   Pulse 75   Temp (!) 97.2 F (36.2 C) (Temporal)   Resp 16   Ht 6\' 3"  (1.905 m)   Wt 189 lb 4 oz (85.8 kg)   SpO2 100%   BMI 23.65 kg/m  General: Well developed, NAD, BMI noted Neck: No  thyromegaly  HEENT:  Normocephalic . Face symmetric, atraumatic Lungs:  CTA B Normal respiratory effort, no intercostal retractions, no accessory muscle use. Heart: RRR,  no murmur.  No pretibial edema bilaterally  Abdomen:   Not distended, soft, non-tender. No rebound or rigidity.   Skin: Exposed areas without rash. Not pale. Not jaundice Neurologic:  alert & oriented X3.  Speech normal, gait appropriate for age and unassisted Strength symmetric and appropriate for age.  Psych: Cognition and judgment appear intact.  Cooperative with normal attention span and concentration.  Behavior appropriate. No anxious or depressed appearing.     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  Watch for side effects. Arthralgias: See last visit note, there at baseline, Lyme serology was negative RTC 1 year   This visit occurred during the SARS-CoV-2 public health emergency.  Safety protocols were in place, including screening questions prior to the visit, additional usage of staff PPE, and extensive cleaning of exam room while observing appropriate contact time as indicated for disinfecting solutions.

## 2019-10-26 NOTE — Assessment & Plan Note (Signed)
-  Td 2018 - had a flu shot  -CCS: Never had a colonoscopy , no family history -Prostate cancer screening: DRE-PSA wnl in 2019 -Screening for   AAA: Negative ultrasound 09-2018 - pt cancelled CT chest ordered lat year - labs: CMP, FLP, CBC -Diet and exercise discussed

## 2019-10-26 NOTE — Assessment & Plan Note (Signed)
For CPX Hyperlipidemia: Diet controlled, last FLP very good, recheck today ED: Mild difficulty with erections, is bothering his relationship with wife, we agreed on a trial sildenafil, how to use and what to expect discussed.  Watch for side effects. Arthralgias: See last visit note, there at baseline, Lyme serology was negative RTC 1 year

## 2019-11-03 ENCOUNTER — Telehealth: Payer: Self-pay

## 2019-11-03 NOTE — Telephone Encounter (Signed)
PA denied. Pt's plan doesn't allow coverage of sildenafil unless Pt has pulmonary arterial hypertension or secondary Raynauds phenomenon.

## 2019-11-03 NOTE — Telephone Encounter (Signed)
PA initiated via Covermymeds; KEY: [id number]. Awaiting determination.

## 2020-08-11 ENCOUNTER — Telehealth: Payer: Self-pay

## 2020-08-11 NOTE — Telephone Encounter (Signed)
PA denied. Insurance plan does not cover for erectile dysfunction.

## 2020-08-11 NOTE — Telephone Encounter (Signed)
PA initiated via Covermymeds; KEY: [id number]. Awaiting determination.

## 2020-10-29 ENCOUNTER — Encounter: Payer: BC Managed Care – PPO | Admitting: Internal Medicine

## 2021-01-21 ENCOUNTER — Other Ambulatory Visit: Payer: Self-pay

## 2021-01-21 ENCOUNTER — Ambulatory Visit (INDEPENDENT_AMBULATORY_CARE_PROVIDER_SITE_OTHER): Payer: BC Managed Care – PPO | Admitting: Internal Medicine

## 2021-01-21 ENCOUNTER — Encounter: Payer: Self-pay | Admitting: Internal Medicine

## 2021-01-21 VITALS — BP 136/82 | HR 67 | Temp 98.2°F | Resp 16 | Ht 75.0 in | Wt 177.5 lb

## 2021-01-21 DIAGNOSIS — Z0000 Encounter for general adult medical examination without abnormal findings: Secondary | ICD-10-CM | POA: Diagnosis not present

## 2021-01-21 DIAGNOSIS — I341 Nonrheumatic mitral (valve) prolapse: Secondary | ICD-10-CM

## 2021-01-21 DIAGNOSIS — E785 Hyperlipidemia, unspecified: Secondary | ICD-10-CM

## 2021-01-21 DIAGNOSIS — Z1211 Encounter for screening for malignant neoplasm of colon: Secondary | ICD-10-CM

## 2021-01-21 DIAGNOSIS — Z1159 Encounter for screening for other viral diseases: Secondary | ICD-10-CM | POA: Diagnosis not present

## 2021-01-21 DIAGNOSIS — Z136 Encounter for screening for cardiovascular disorders: Secondary | ICD-10-CM

## 2021-01-21 LAB — LIPID PANEL
Cholesterol: 172 mg/dL (ref 0–200)
HDL: 46.3 mg/dL (ref >39.00)
LDL Cholesterol: 111 mg/dL — ABNORMAL HIGH (ref 0–99)
NonHDL: 126.19
Total CHOL/HDL Ratio: 4
Triglycerides: 75 mg/dL (ref 0.0–149.0)
VLDL: 15 mg/dL (ref 0.0–40.0)

## 2021-01-21 LAB — COMPREHENSIVE METABOLIC PANEL
ALT: 9 U/L (ref 0–53)
AST: 10 U/L (ref 0–37)
Albumin: 4.2 g/dL (ref 3.5–5.2)
Alkaline Phosphatase: 42 U/L (ref 39–117)
BUN: 18 mg/dL (ref 6–23)
CO2: 29 mEq/L (ref 19–32)
Calcium: 9.2 mg/dL (ref 8.4–10.5)
Chloride: 103 mEq/L (ref 96–112)
Creatinine, Ser: 1.19 mg/dL (ref 0.40–1.50)
GFR: 72.45 mL/min (ref >60.00)
Glucose, Bld: 92 mg/dL (ref 70–99)
Potassium: 4.3 mEq/L (ref 3.5–5.1)
Sodium: 138 mEq/L (ref 135–145)
Total Bilirubin: 0.7 mg/dL (ref 0.2–1.2)
Total Protein: 6.9 g/dL (ref 6.0–8.3)

## 2021-01-21 LAB — CBC WITH DIFFERENTIAL/PLATELET
Basophils Absolute: 0 10*3/uL (ref 0.0–0.1)
Basophils Relative: 0.6 % (ref 0.0–3.0)
Eosinophils Absolute: 0.1 10*3/uL (ref 0.0–0.7)
Eosinophils Relative: 1.7 % (ref 0.0–5.0)
HCT: 42.6 % (ref 39.0–52.0)
Hemoglobin: 14.4 g/dL (ref 13.0–17.0)
Lymphocytes Relative: 32.6 % (ref 12.0–46.0)
Lymphs Abs: 1.6 10*3/uL (ref 0.7–4.0)
MCHC: 33.9 g/dL (ref 30.0–36.0)
MCV: 85.5 fl (ref 78.0–100.0)
Monocytes Absolute: 0.4 10*3/uL (ref 0.1–1.0)
Monocytes Relative: 8 % (ref 3.0–12.0)
Neutro Abs: 2.9 10*3/uL (ref 1.4–7.7)
Neutrophils Relative %: 57.1 % (ref 43.0–77.0)
Platelets: 259 10*3/uL (ref 150.0–400.0)
RBC: 4.98 Mil/uL (ref 4.22–5.81)
RDW: 14.1 % (ref 11.5–15.5)
WBC: 5 10*3/uL (ref 4.0–10.5)

## 2021-01-21 LAB — HEPATITIS C ANTIBODY
Hepatitis C Ab: NONREACTIVE
SIGNAL TO CUT-OFF: 0.01 (ref <1.00)

## 2021-01-21 NOTE — Progress Notes (Signed)
   Subjective:    Patient ID: Michael Sampson, male    DOB: [birth year], 50 y.o.   MRN: [id number]  DOS:  01/21/2021 Type of visit - description: Here for CPX In general feels well.  Review of Systems  Other than above, a 14 point review of systems is negative     Past Medical History:  Diagnosis Date  . Calculus of kidney 10/02/2014  . Hyperlipidemia   . Mitral valve prolapse    no SBE prophylaxis    Past Surgical History:  Procedure Laterality Date  . pectus carinatum     surgical correction at age 34  . WISDOM TOOTH EXTRACTION      Allergies as of 01/21/2021   No Known Allergies     Medication List       Accurate as of January 21, 2021 11:59 PM. If you have any questions, ask your nurse or doctor.        STOP taking these medications   multivitamin capsule Stopped by: Kathlene November, MD   sildenafil 20 MG tablet Commonly known as: REVATIO Stopped by: Kathlene November, MD          Objective:   Physical Exam BP 136/82 (BP Location: Left Arm, Patient Position: Sitting, Cuff Size: Small)   Pulse 67   Temp 98.2 F (36.8 C) (Oral)   Resp 16   Ht 6\' 3"  (1.905 m)   Wt 177 lb 8 oz (80.5 kg)   SpO2 98%   BMI 22.19 kg/m  General: Well developed, NAD, BMI noted Neck: No  thyromegaly  HEENT:  Normocephalic . Face symmetric, atraumatic Lungs:  CTA B Normal respiratory effort, no intercostal retractions, no accessory muscle use. Heart: RRR,  no murmur.  Abdomen:  Not distended, soft, non-tender. No rebound or rigidity.  No bruit Lower extremities: no pretibial edema bilaterally  Skin: Exposed areas without rash. Not pale. Not jaundice Neurologic:  alert & oriented X3.  Speech normal, gait appropriate for age and unassisted Strength symmetric and appropriate for age.  Psych: Cognition and judgment appear intact.  Cooperative with normal attention span and concentration.  Behavior appropriate. No anxious or depressed appearing.     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 RTC 1 year    This visit occurred during the SARS-CoV-2 public health emergency.  Safety protocols were in place, including screening questions prior to the visit, additional usage of staff PPE, and extensive cleaning of exam room while observing appropriate contact time as indicated for disinfecting solutions.

## 2021-01-21 NOTE — Patient Instructions (Signed)
Please call the gastroenterology office to schedule your colonoscopy [phone]   GO TO THE LAB : Get the blood work     Am[place], [place] back for   a physical exam in 1 year  Go to the first floor and schedule ultrasound of your abdominal aorta  The chest echocardiogram will be done at [facility], cardiology office, expect a phone call

## 2021-01-23 ENCOUNTER — Encounter: Payer: Self-pay | Admitting: Internal Medicine

## 2021-01-23 NOTE — Assessment & Plan Note (Signed)
-  Td 2018 -COVID VAX: strongly declined , benefits d/w pt  - had a flu shot -CCS: requests a cscope, GI referral  -Prostate cancer screening: DRE-PSA wnl in 2019, no FH, reassess at age 50 -Screening for   AAA: [place] 09-2018, (-), request to repeat the screening due to [place] of aortic dissection.  Will do -Labs: CMP, FLP, CBC, hep C -Diet and exercise discussed

## 2021-01-23 NOTE — Assessment & Plan Note (Signed)
For CPX Hyperlipidemia: Diet controlled, check labs MVP: Patient is concerned about his heart, + FH aortic dissection, check echo. RTC 1 year

## 2021-02-21 ENCOUNTER — Encounter: Payer: Self-pay | Admitting: Gastroenterology

## 2021-02-25 ENCOUNTER — Other Ambulatory Visit: Payer: Self-pay

## 2021-02-25 ENCOUNTER — Ambulatory Visit (HOSPITAL_COMMUNITY): Payer: BC Managed Care – PPO | Attending: Cardiology

## 2021-02-25 DIAGNOSIS — I341 Nonrheumatic mitral (valve) prolapse: Secondary | ICD-10-CM | POA: Diagnosis not present

## 2021-02-25 LAB — ECHOCARDIOGRAM COMPLETE
Area-P 1/2: 1.84 cm2
S' Lateral: 2.8 cm

## 2021-02-25 NOTE — Progress Notes (Signed)
Patient ID: Michael Sampson, male   DOB: 23-Feb-1971, 50 y.o.   MRN: [id number]   Per order, Do not administer Definity.

## 2021-03-14 ENCOUNTER — Other Ambulatory Visit (HOSPITAL_BASED_OUTPATIENT_CLINIC_OR_DEPARTMENT_OTHER): Payer: BC Managed Care – PPO

## 2021-03-21 ENCOUNTER — Other Ambulatory Visit (HOSPITAL_BASED_OUTPATIENT_CLINIC_OR_DEPARTMENT_OTHER): Payer: BC Managed Care – PPO

## 2021-03-25 ENCOUNTER — Ambulatory Visit (HOSPITAL_BASED_OUTPATIENT_CLINIC_OR_DEPARTMENT_OTHER)
Admission: RE | Admit: 2021-03-25 | Discharge: 2021-03-25 | Disposition: A | Discharge status: Home / Self Care | Payer: BC Managed Care – PPO | Source: Ambulatory Visit | Attending: Internal Medicine | Admitting: Internal Medicine

## 2021-03-25 ENCOUNTER — Other Ambulatory Visit: Payer: Self-pay

## 2021-03-25 DIAGNOSIS — Z136 Encounter for screening for cardiovascular disorders: Secondary | ICD-10-CM

## 2021-03-25 NOTE — Progress Notes (Signed)
AAA duplex completed. Refer to "CV Proc" under chart review to view preliminary results.  03/25/2021 9:29 AM Kelby Aline., MHA, RVT, RDCS, RDMS

## 2021-04-01 ENCOUNTER — Other Ambulatory Visit (HOSPITAL_BASED_OUTPATIENT_CLINIC_OR_DEPARTMENT_OTHER): Payer: BC Managed Care – PPO

## 2021-05-13 ENCOUNTER — Ambulatory Visit (AMBULATORY_SURGERY_CENTER): Payer: BC Managed Care – PPO | Admitting: *Deleted

## 2021-05-13 ENCOUNTER — Other Ambulatory Visit: Payer: Self-pay

## 2021-05-13 VITALS — Ht 75.0 in | Wt 177.0 lb

## 2021-05-13 DIAGNOSIS — Z1211 Encounter for screening for malignant neoplasm of colon: Secondary | ICD-10-CM

## 2021-05-13 MED ORDER — CLENPIQ 10-3.5-12 MG-GM -GM/160ML PO SOLN: 1.0000 | ORAL | 0 refills | Status: DC

## 2021-05-13 NOTE — Progress Notes (Signed)
Patient is here in-person for PV. Patient denies any allergies to eggs or soy. Patient denies any problems with anesthesia/sedation. Patient denies any oxygen use at home. Patient denies taking any diet/weight loss medications or blood thinners. Patient is not being treated for MRSA or C-diff. Patient is aware of our care-partner policy and [id number] safety protocol. EMMI education assigned to the patient for the procedure, sent to [place] [facility].    Prep Prescription coupon given to the patient.

## 2021-05-27 ENCOUNTER — Ambulatory Visit (AMBULATORY_SURGERY_CENTER): Payer: BC Managed Care – PPO | Admitting: Gastroenterology

## 2021-05-27 ENCOUNTER — Other Ambulatory Visit: Payer: Self-pay

## 2021-05-27 ENCOUNTER — Encounter: Payer: Self-pay | Admitting: Gastroenterology

## 2021-05-27 VITALS — BP 117/70 | HR 74 | Temp 97.7°F | Resp 17 | Ht 75.0 in | Wt 177.0 lb

## 2021-05-27 DIAGNOSIS — Z1211 Encounter for screening for malignant neoplasm of colon: Secondary | ICD-10-CM

## 2021-05-27 DIAGNOSIS — D125 Benign neoplasm of sigmoid colon: Secondary | ICD-10-CM | POA: Diagnosis not present

## 2021-05-27 DIAGNOSIS — D124 Benign neoplasm of descending colon: Secondary | ICD-10-CM

## 2021-05-27 MED ORDER — SODIUM CHLORIDE 0.9 % IV SOLN: 500.0000 mL | Freq: Once | INTRAVENOUS | Status: DC

## 2021-05-27 NOTE — Progress Notes (Signed)
Report given to PACU, vss 

## 2021-05-27 NOTE — Progress Notes (Signed)
Called to room to assist during endoscopic procedure.  Patient ID and intended procedure confirmed with present staff. Received instructions for my participation in the procedure from the performing physician.  

## 2021-05-27 NOTE — Op Note (Signed)
[facility] Patient Name: Michael Sampson Procedure Date: 05/27/2021 9:52 AM MRN: [id number] Endoscopist: Gerrit Heck , MD Age: 50 Referring MD:  Date of Birth: 10-Jun-1971 Gender: Male Account #: [id number] Procedure:                Colonoscopy Indications:              Screening for colorectal malignant neoplasm, This                            is the patient's first colonoscopy Medicines:                Monitored Anesthesia Care Procedure:                Pre-Anesthesia Assessment:                           - Prior to the procedure, a History and Physical                            was performed, and patient medications and                            allergies were reviewed. The patient's tolerance of                            previous anesthesia was also reviewed. The risks                            and benefits of the procedure and the sedation                            options and risks were discussed with the patient.                            All questions were answered, and informed consent                            was obtained. Prior Anticoagulants: The patient has                            taken no previous anticoagulant or antiplatelet                            agents. ASA Grade Assessment: II - A patient with                            mild systemic disease. After reviewing the risks                            and benefits, the patient was deemed in                            satisfactory condition to undergo the procedure.  After obtaining informed consent, the colonoscope                            was passed under direct vision. Throughout the                            procedure, the patient's blood pressure, pulse, and                            oxygen saturations were monitored continuously. The                            Olympus CF-HQ190L [id number]) Colonoscope was                            introduced through the anus and  advanced to the the                            cecum, identified by the ileocecal valve. The                            colonoscopy was performed without difficulty. The                            patient tolerated the procedure well. The quality                            of the bowel preparation was good. The ileocecal                            valve, appendiceal orifice, and rectum were                            photographed. Scope In: 10:17:24 AM Scope Out: 10:35:23 AM Scope Withdrawal Time: 0 hours 16 minutes 19 seconds  Total Procedure Duration: 0 hours 17 minutes 59 seconds  Findings:                 The perianal and digital rectal examinations were                            normal.                           Three sessile polyps were found in the sigmoid                            colon and descending colon. The polyps were 2 to 5                            mm in size. These polyps were removed with a cold                            snare. Resection and retrieval were complete.  Estimated blood loss was minimal.                           The exam was otherwise normal throughout the                            remainder of the colon.                           The retroflexed view of the distal rectum and anal                            verge was normal and showed no anal or rectal                            abnormalities. Complications:            No immediate complications. Estimated Blood Loss:     Estimated blood loss was minimal. Impression:               - Three 2 to 5 mm polyps in the sigmoid colon and                            in the descending colon, removed with a cold snare.                            Resected and retrieved.                           - The distal rectum and anal verge are normal on                            retroflexion view. Recommendation:           - Patient has a contact number available for                             emergencies. The signs and symptoms of potential                            delayed complications were discussed with the                            patient. Return to normal activities tomorrow.                            Written discharge instructions were provided to the                            patient.                           - Resume previous diet.                           - Continue present medications.                           -  Await pathology results.                           - Repeat colonoscopy for surveillance based on                            pathology results.                           - Return to GI office PRN. Gerrit Heck, MD 05/27/2021 10:39:54 AM

## 2021-05-27 NOTE — Progress Notes (Signed)
VS- Michael Sampson  Pt's states no medical or surgical changes since previsit or office visit.  

## 2021-05-27 NOTE — Patient Instructions (Signed)
YOU HAD AN ENDOSCOPIC PROCEDURE TODAY AT [facility]:   Refer to the procedure report that was given to you for any specific questions about what was found during the examination.  If the procedure report does not answer your questions, please call your gastroenterologist to clarify.  If you requested that your care partner not be given the details of your procedure findings, then the procedure report has been included in a sealed envelope for you to review at your convenience later.  YOU SHOULD EXPECT: Some feelings of bloating in the abdomen. Passage of more gas than usual.  Walking can help get rid of the air that was put into your GI tract during the procedure and reduce the bloating. If you had a lower endoscopy (such as a colonoscopy or flexible sigmoidoscopy) you may notice spotting of blood in your stool or on the toilet paper. If you underwent a bowel prep for your procedure, you may not have a normal bowel movement for a few days.  Please Note:  You might notice some irritation and congestion in your nose or some drainage.  This is from the oxygen used during your procedure.  There is no need for concern and it should clear up in a day or so.  SYMPTOMS TO REPORT IMMEDIATELY:  Following lower endoscopy (colonoscopy or flexible sigmoidoscopy):  Excessive amounts of blood in the stool  Significant tenderness or worsening of abdominal pains  Swelling of the abdomen that is new, acute  Fever of 100F or higher    For urgent or emergent issues, a gastroenterologist can be reached at any hour by calling [phone]. Do not use MyChart messaging for urgent concerns.    DIET:  We do recommend a small meal at first, but then you may proceed to your regular diet.  Drink plenty of fluids but you should avoid alcoholic beverages for 24 hours.  ACTIVITY:  You should plan to take it easy for the rest of today and you should NOT DRIVE or use heavy machinery until tomorrow (because  of the sedation medicines used during the test).    FOLLOW UP: Our staff will call the number listed on your records 48-72 hours following your procedure to check on you and address any questions or concerns that you may have regarding the information given to you following your procedure. If we do not reach you, we will leave a message.  We will attempt to reach you two times.  During this call, we will ask if you have developed any symptoms of COVID 19. If you develop any symptoms (ie: fever, flu-like symptoms, shortness of breath, cough etc.) before then, please call [phone].  If you test positive for Covid 19 in the 2 weeks post procedure, please call and report this information to [place].    If any biopsies were taken you will be contacted by phone or by letter within the next 1-3 weeks.  Please call us at [phone] if you have not heard about the biopsies in 3 weeks.    SIGNATURES/CONFIDENTIALITY: You and/or your care partner have signed paperwork which will be entered into your electronic medical record.  These signatures attest to the fact that that the information above on your After Visit Summary has been reviewed and is understood.  Full responsibility of the confidentiality of this discharge information lies with you and/or your care-partner.    RESUME MEDICATIONS. INFORMATION GIVEN ON POLYPS.

## 2021-05-31 ENCOUNTER — Telehealth: Payer: Self-pay | Admitting: *Deleted

## 2021-05-31 NOTE — Telephone Encounter (Signed)
  Follow up Call-  Call back number 05/27/2021  Post procedure Call Back phone  # [phone]  Permission to leave phone message Yes  Some recent data might be hidden     Patient questions:  Do you have a fever, pain , or abdominal swelling? No. Pain Score  0 *  Have you tolerated food without any problems? Yes.    Have you been able to return to your normal activities? Yes.    Do you have any questions about your discharge instructions: Diet   No. Medications  No. Follow up visit  No.  Do you have questions or concerns about your Care? No.  Actions: * If pain score is 4 or above: No action needed, pain <4.  Have you developed a fever since your procedure? no  2.   Have you had an respiratory symptoms (SOB or cough) since your procedure? no  3.   Have you tested positive for COVID 19 since your procedure no  4.   Have you had any family members/close contacts diagnosed with the COVID 19 since your procedure?  no   If yes to any of these questions please route to Joylene John, RN and Joella Prince, RN

## 2021-05-31 NOTE — Telephone Encounter (Signed)
  Follow up Call-  Call back number 05/27/2021  Post procedure Call Back phone  # [phone]  Permission to leave phone message Yes  Some recent data might be hidden     Patient questions: Message left to call us if necessary.

## 2021-06-02 ENCOUNTER — Encounter: Payer: Self-pay | Admitting: Gastroenterology

## 2021-07-22 ENCOUNTER — Ambulatory Visit (HOSPITAL_BASED_OUTPATIENT_CLINIC_OR_DEPARTMENT_OTHER)
Admission: RE | Admit: 2021-07-22 | Discharge: 2021-07-22 | Disposition: A | Discharge status: Home / Self Care | Payer: BC Managed Care – PPO | Source: Ambulatory Visit | Attending: Internal Medicine | Admitting: Internal Medicine

## 2021-07-22 ENCOUNTER — Other Ambulatory Visit: Payer: Self-pay

## 2021-07-22 ENCOUNTER — Encounter: Payer: Self-pay | Admitting: Internal Medicine

## 2021-07-22 ENCOUNTER — Ambulatory Visit: Payer: BC Managed Care – PPO | Admitting: Internal Medicine

## 2021-07-22 VITALS — BP 136/82 | HR 91 | Temp 98.3°F | Resp 16 | Ht 75.0 in | Wt 179.1 lb

## 2021-07-22 DIAGNOSIS — R079 Chest pain, unspecified: Secondary | ICD-10-CM | POA: Insufficient documentation

## 2021-07-22 DIAGNOSIS — M549 Dorsalgia, unspecified: Secondary | ICD-10-CM | POA: Diagnosis not present

## 2021-07-22 DIAGNOSIS — M545 Low back pain, unspecified: Secondary | ICD-10-CM | POA: Diagnosis not present

## 2021-07-22 LAB — CBC WITH DIFFERENTIAL/PLATELET
Basophils Absolute: 0 10*3/uL (ref 0.0–0.1)
Basophils Relative: 0.6 % (ref 0.0–3.0)
Eosinophils Absolute: 0.1 10*3/uL (ref 0.0–0.7)
Eosinophils Relative: 2.1 % (ref 0.0–5.0)
HCT: 43.8 % (ref 39.0–52.0)
Hemoglobin: 14.5 g/dL (ref 13.0–17.0)
Lymphocytes Relative: 29.6 % (ref 12.0–46.0)
Lymphs Abs: 1.8 10*3/uL (ref 0.7–4.0)
MCHC: 33.2 g/dL (ref 30.0–36.0)
MCV: 87 fl (ref 78.0–100.0)
Monocytes Absolute: 0.4 10*3/uL (ref 0.1–1.0)
Monocytes Relative: 6.9 % (ref 3.0–12.0)
Neutro Abs: 3.7 10*3/uL (ref 1.4–7.7)
Neutrophils Relative %: 60.8 % (ref 43.0–77.0)
Platelets: 281 10*3/uL (ref 150.0–400.0)
RBC: 5.03 Mil/uL (ref 4.22–5.81)
RDW: 13.8 % (ref 11.5–15.5)
WBC: 6.1 10*3/uL (ref 4.0–10.5)

## 2021-07-22 LAB — SEDIMENTATION RATE: Sed Rate: 9 mm/hr (ref 0–15)

## 2021-07-22 LAB — CK: Total CK: 86 U/L (ref 7–232)

## 2021-07-22 MED ORDER — MELOXICAM 7.5 MG PO TABS: 7.5000 mg | ORAL_TABLET | Freq: Every day | ORAL | 0 refills | Status: DC | PRN

## 2021-07-22 MED ORDER — CYCLOBENZAPRINE HCL 10 MG PO TABS: 10.0000 mg | ORAL_TABLET | Freq: Every evening | ORAL | 0 refills | Status: DC | PRN

## 2021-07-22 NOTE — Patient Instructions (Addendum)
Recommend to proceed with covid vaccine series at your pharmacy.  Start taking meloxicam 7.5 mg: 1 or 2 tablets a day as needed for pain.  Always take it with food because may cause gastritis and ulcers.  If you notice nausea, stomach pain, change in the color of stools --->  Stop the medicine and let us know  Call if not gradually better in the next 2 to 3 weeks   GO TO THE LAB : Get the blood work      STOP BY THE FIRST FLOOR:  get the XR

## 2021-07-22 NOTE — Progress Notes (Signed)
Subjective:    Patient ID: Michael Sampson, male    DOB: 08-Feb-1971, 50 y.o.   MRN: [id number]  DOS:  07/22/2021 Type of visit - description: Acute visit Over the last month has developed pain at different locations, pain worsening over last 2 weeks. Pain locations: Low back pain, worse when he bends over, no radiation. Left-sided upper back/trapezoid area, worse with certain head movements. Bilateral chest pain, worse on the right.  Worse with certain movements or deep inspiration.   Denies any fever chills. No weight loss. Has not taken any medication for it. No recent increase in physically active.  Not taking any new medications. Denies any rash No upper or lower extremity paresthesias. No myalgias per se. No cough or sputum production No recent tick bite. No headache or visual disturbances  Review of Systems See above   Past Medical History:  Diagnosis Date   Calculus of kidney 10/02/2014   Hyperlipidemia    Mitral valve prolapse    no SBE prophylaxis    Past Surgical History:  Procedure Laterality Date   pectus carinatum     surgical correction at age 76   WISDOM TOOTH EXTRACTION      Allergies as of 07/22/2021   No Known Allergies      Medication List        Accurate as of July 22, 2021 10:31 AM. If you have any questions, ask your nurse or doctor.          Ibuprofen 200 MG Caps Take 2 capsules by mouth once a week.   VITAMIN D3 PO Take 2 tablets by mouth daily.           Objective:   Physical Exam BP 136/82 (BP Location: Left Arm, Patient Position: Sitting, Cuff Size: Small)   Pulse 91   Temp 98.3 F (36.8 C) (Oral)   Resp 16   Ht '6\' 3"'$  (1.905 m)   Wt 179 lb 2 oz (81.3 kg)   SpO2 98%   BMI 22.39 kg/m  General:   Well developed, NAD, BMI noted. HEENT:  Normocephalic . Face symmetric, atraumatic Neck: No thyromegaly, no TTP of the cervical spine, range of motion normal Lungs:  CTA B Normal respiratory effort, no  intercostal retractions, no accessory muscle use. Heart: RRR,  no murmur.  Lower extremities: no pretibial edema bilaterally Upper extremities: Hands and wrists with no synovitis Skin: Not pale. Not jaundice Neurologic:  alert & oriented X3.  Speech normal, gait appropriate for age and unassisted Motor and DTR symmetric.  EOMI Psych--  Cognition and judgment appear intact.  Cooperative with normal attention span and concentration.  Behavior appropriate. No anxious or depressed appearing.      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 Etiology not completely clear, we agreed on a step-by-step fashion w/u. Will start by drawing CBC, sed rate, total CK.  He also requested a chest x-ray given right-sided chest pain. Symptomatic treatment with meloxicam, strict GI precautions discussed. After some reluctance, he agreed to be prescribed  Flexeril, to be taken at night, watch for drowsiness If not gradually better will consider further evaluation.  This visit occurred during the SARS-CoV-2 public health emergency.  Safety protocols  were in place, including screening questions prior to the visit, additional usage of staff PPE, and extensive cleaning of exam room while observing appropriate contact time as indicated for disinfecting solutions.

## 2021-07-23 NOTE — Assessment & Plan Note (Signed)
Neck, back, chest pain: Having pain as described above, no systemic symptoms, ROS benign, physical exam benign. Etiology not completely clear, we agreed on a step-by-step fashion w/u. Will start by drawing CBC, sed rate, total CK.  He also requested a chest x-ray given right-sided chest pain. Symptomatic treatment with meloxicam, strict GI precautions discussed. After some reluctance, he agreed to be prescribed  Flexeril, to be taken at night, watch for drowsiness If not gradually better will consider further evaluation.

## 2022-01-27 ENCOUNTER — Encounter: Payer: BC Managed Care – PPO | Admitting: Internal Medicine

## 2022-03-03 ENCOUNTER — Encounter: Payer: Self-pay | Admitting: Internal Medicine

## 2022-03-03 ENCOUNTER — Ambulatory Visit (INDEPENDENT_AMBULATORY_CARE_PROVIDER_SITE_OTHER): Payer: BC Managed Care – PPO | Admitting: Internal Medicine

## 2022-03-03 VITALS — BP 116/76 | HR 75 | Temp 98.0°F | Resp 16 | Ht 75.0 in | Wt 182.4 lb

## 2022-03-03 DIAGNOSIS — Z0000 Encounter for general adult medical examination without abnormal findings: Secondary | ICD-10-CM

## 2022-03-03 LAB — CBC WITH DIFFERENTIAL/PLATELET
Basophils Absolute: 0 10*3/uL (ref 0.0–0.1)
Basophils Relative: 0.5 % (ref 0.0–3.0)
Eosinophils Absolute: 0.2 10*3/uL (ref 0.0–0.7)
Eosinophils Relative: 2.9 % (ref 0.0–5.0)
HCT: 42.7 % (ref 39.0–52.0)
Hemoglobin: 14.7 g/dL (ref 13.0–17.0)
Lymphocytes Relative: 31.1 % (ref 12.0–46.0)
Lymphs Abs: 1.8 10*3/uL (ref 0.7–4.0)
MCHC: 34.3 g/dL (ref 30.0–36.0)
MCV: 85.4 fl (ref 78.0–100.0)
Monocytes Absolute: 0.5 10*3/uL (ref 0.1–1.0)
Monocytes Relative: 8.8 % (ref 3.0–12.0)
Neutro Abs: 3.2 10*3/uL (ref 1.4–7.7)
Neutrophils Relative %: 56.7 % (ref 43.0–77.0)
Platelets: 253 10*3/uL (ref 150.0–400.0)
RBC: 5.01 Mil/uL (ref 4.22–5.81)
RDW: 13.6 % (ref 11.5–15.5)
WBC: 5.6 10*3/uL (ref 4.0–10.5)

## 2022-03-03 LAB — COMPREHENSIVE METABOLIC PANEL
ALT: 12 U/L (ref 0–53)
AST: 15 U/L (ref 0–37)
Albumin: 4.3 g/dL (ref 3.5–5.2)
Alkaline Phosphatase: 36 U/L — ABNORMAL LOW (ref 39–117)
BUN: 16 mg/dL (ref 6–23)
CO2: 28 mEq/L (ref 19–32)
Calcium: 8.8 mg/dL (ref 8.4–10.5)
Chloride: 102 mEq/L (ref 96–112)
Creatinine, Ser: 1.18 mg/dL (ref 0.40–1.50)
GFR: 72.62 mL/min (ref >60.00)
Glucose, Bld: 91 mg/dL (ref 70–99)
Potassium: 3.8 mEq/L (ref 3.5–5.1)
Sodium: 138 mEq/L (ref 135–145)
Total Bilirubin: 1.1 mg/dL (ref 0.2–1.2)
Total Protein: 6.8 g/dL (ref 6.0–8.3)

## 2022-03-03 LAB — LIPID PANEL
Cholesterol: 181 mg/dL (ref 0–200)
HDL: 46.6 mg/dL (ref >39.00)
LDL Cholesterol: 120 mg/dL — ABNORMAL HIGH (ref 0–99)
NonHDL: 134.13
Total CHOL/HDL Ratio: 4
Triglycerides: 69 mg/dL (ref 0.0–149.0)
VLDL: 13.8 mg/dL (ref 0.0–40.0)

## 2022-03-03 LAB — TSH: TSH: 2.18 u[IU]/mL (ref 0.35–5.50)

## 2022-03-03 LAB — PSA: PSA: 0.7 ng/mL (ref 0.10–4.00)

## 2022-03-03 NOTE — Patient Instructions (Addendum)
   GO TO THE LAB : Get the blood work     GO TO THE FRONT DESK, PLEASE SCHEDULE YOUR APPOINTMENTS Come back for a physical exam in 1 year 

## 2022-03-03 NOTE — Progress Notes (Signed)
? ?Subjective:  ? ? Patient ID: Michael Sampson, male    DOB: 10-12-1971, 51 y.o.   MRN: [id number] ? ?DOS:  03/03/2022 ?Type of visit - description: CPX ? ?Here for CPX. ?Lost his father last night, he was my patient. ?Obviously affected by the loss. ?Physically he feels well except that from time to time has a sharp pain at the left anterior chest.  No previous injury. ? ?Wt Readings from Last 3 Encounters:  ?03/03/22 182 lb 6 oz (82.7 kg)  ?07/22/21 179 lb 2 oz (81.3 kg)  ?05/27/21 177 lb (80.3 kg)  ? ? ?Review of Systems ? ?Other than above, a 14 point review of systems is negative  ? ?  ? ? ?Past Medical History:  ?Diagnosis Date  ? Calculus of kidney 10/02/2014  ? Hyperlipidemia   ? Mitral valve prolapse   ? no SBE prophylaxis  ? ? ?Past Surgical History:  ?Procedure Laterality Date  ? pectus carinatum    ? surgical correction at age 40  ? WISDOM TOOTH EXTRACTION    ? ?Social History  ? ?Socioeconomic History  ? Marital status: Married  ?  Spouse name: Not on file  ? Number of children: 2  ? Years of education: Not on file  ? Highest education level: Not on file  ?Occupational History  ? Occupation: Pension scheme manager   ?Tobacco Use  ? Smoking status: Never  ? Smokeless tobacco: Never  ?Vaping Use  ? Vaping Use: Never used  ?Substance and Sexual Activity  ? Alcohol use: No  ?  Alcohol/week: 0.0 standard drinks  ? Drug use: No  ? Sexual activity: Not on file  ?Other Topics Concern  ? Not on file  ?Social History Narrative  ? Household-- pt , wife and 2 children 2003, 2010  ? ?Social Determinants of Health  ? ?Financial Resource Strain: Not on file  ?Food Insecurity: Not on file  ?Transportation Needs: Not on file  ?Physical Activity: Not on file  ?Stress: Not on file  ?Social Connections: Not on file  ?Intimate Partner Violence: Not on file  ? ? ?Current Outpatient Medications  ?Medication Instructions  ? Ibuprofen 200 MG CAPS 2 capsules, Oral, Weekly  ? ? ?   ?Objective:  ? Physical Exam ?BP 116/76 (BP Location:  Left Arm, Patient Position: Sitting, Cuff Size: Small)   Pulse 75   Temp 98 ?F (36.7 ?C) (Oral)   Resp 16   Ht '6\' 3"'$  (1.905 m)   Wt 182 lb 6 oz (82.7 kg)   SpO2 98%   BMI 22.80 kg/m?  ?General: ?Well developed, NAD, BMI noted ?Neck: No  thyromegaly  ?HEENT:  ?Normocephalic . Face symmetric, atraumatic ?Lungs:  ?CTA B ?Normal respiratory effort, no intercostal retractions, no accessory muscle use. ?Heart: RRR,  no murmur.  ?Abdomen:  ?Not distended, soft, non-tender. No rebound or rigidity.   ?Lower extremities: no pretibial edema bilaterally ?DRE: Declined   ?Skin: Exposed areas without rash. Not pale. Not jaundice ?Neurologic:  ?alert & oriented X3.  ?Speech normal, gait appropriate for age and unassisted ?Strength symmetric and appropriate for age.  ?Psych: ?Cognition and judgment appear intact.  ?Cooperative with normal attention span and concentration.  ?Behavior appropriate. ?No anxious or depressed appearing. ? ?   ?Assessment   ? ?  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 ? ?PLAN: ?Here for CPX ?Hyperlipidemia: Last calc CV RF  was 2.3, not on statins, has a healthy lifestyle.  Check labs ?MVP: No murmur today. ?L chest sharp pain: Patient is very nervous about it, no fever, chills,  not a smoker, chest x-ray few months ago negative.  He wonders if his lungs "can be checked".  I don't think further testing is needed at this time, he well keep me posted ?RTC 1 year ? ? ?This visit occurred during the SARS-CoV-2 public health emergency.  Safety protocols were in place, including screening questions prior to the visit, additional usage of staff PPE, and extensive cleaning of exam room while observing appropriate contact time as indicated for disinfecting solutions.  ? ?

## 2022-03-05 ENCOUNTER — Encounter: Payer: Self-pay | Admitting: Internal Medicine

## 2022-03-05 NOTE — Assessment & Plan Note (Signed)
Here for CPX ?Hyperlipidemia: Last calc CV RF was 2.3, not on statins, has a healthy lifestyle.  Check labs ?MVP: No murmur today. ?L chest sharp pain: Patient is very nervous about it, no fever, chills,  not a smoker, chest x-ray few months ago negative.  He wonders if his lungs "can be checked".  I don't think further testing is needed at this time, he well keep me posted ?RTC 1 year ?

## 2022-03-05 NOTE — Assessment & Plan Note (Signed)
-  Td 2018 ?-COVID VAX: strongly declined before  ?-CCS: cscope 05/2021, next 2029 per GI letter   ?-Prostate cancer screening: We discussed possibly early screening, he likes to proceed.  Declined DRE, check PSA. ?-Screening for  AAA, FH of aortic dissection: US Aorta 09-2018 and 03/2021  (-) ; also echo 02-2021: Ao wnl ?-Labs: CMP, FLP, CBC, PSA ?-Diet  described as healthy, encourage not only be active at work but also exercise. ?   ?

## 2022-12-28 IMAGING — CR DG CHEST 2V
2 series · 2 of 2 positions shown · non-contrast
Comparison: 08/22/2013

CLINICAL DATA: Right-sided chest pain and shortness of breath.

EXAM:
CHEST - 2 VIEW

[w chest pa]
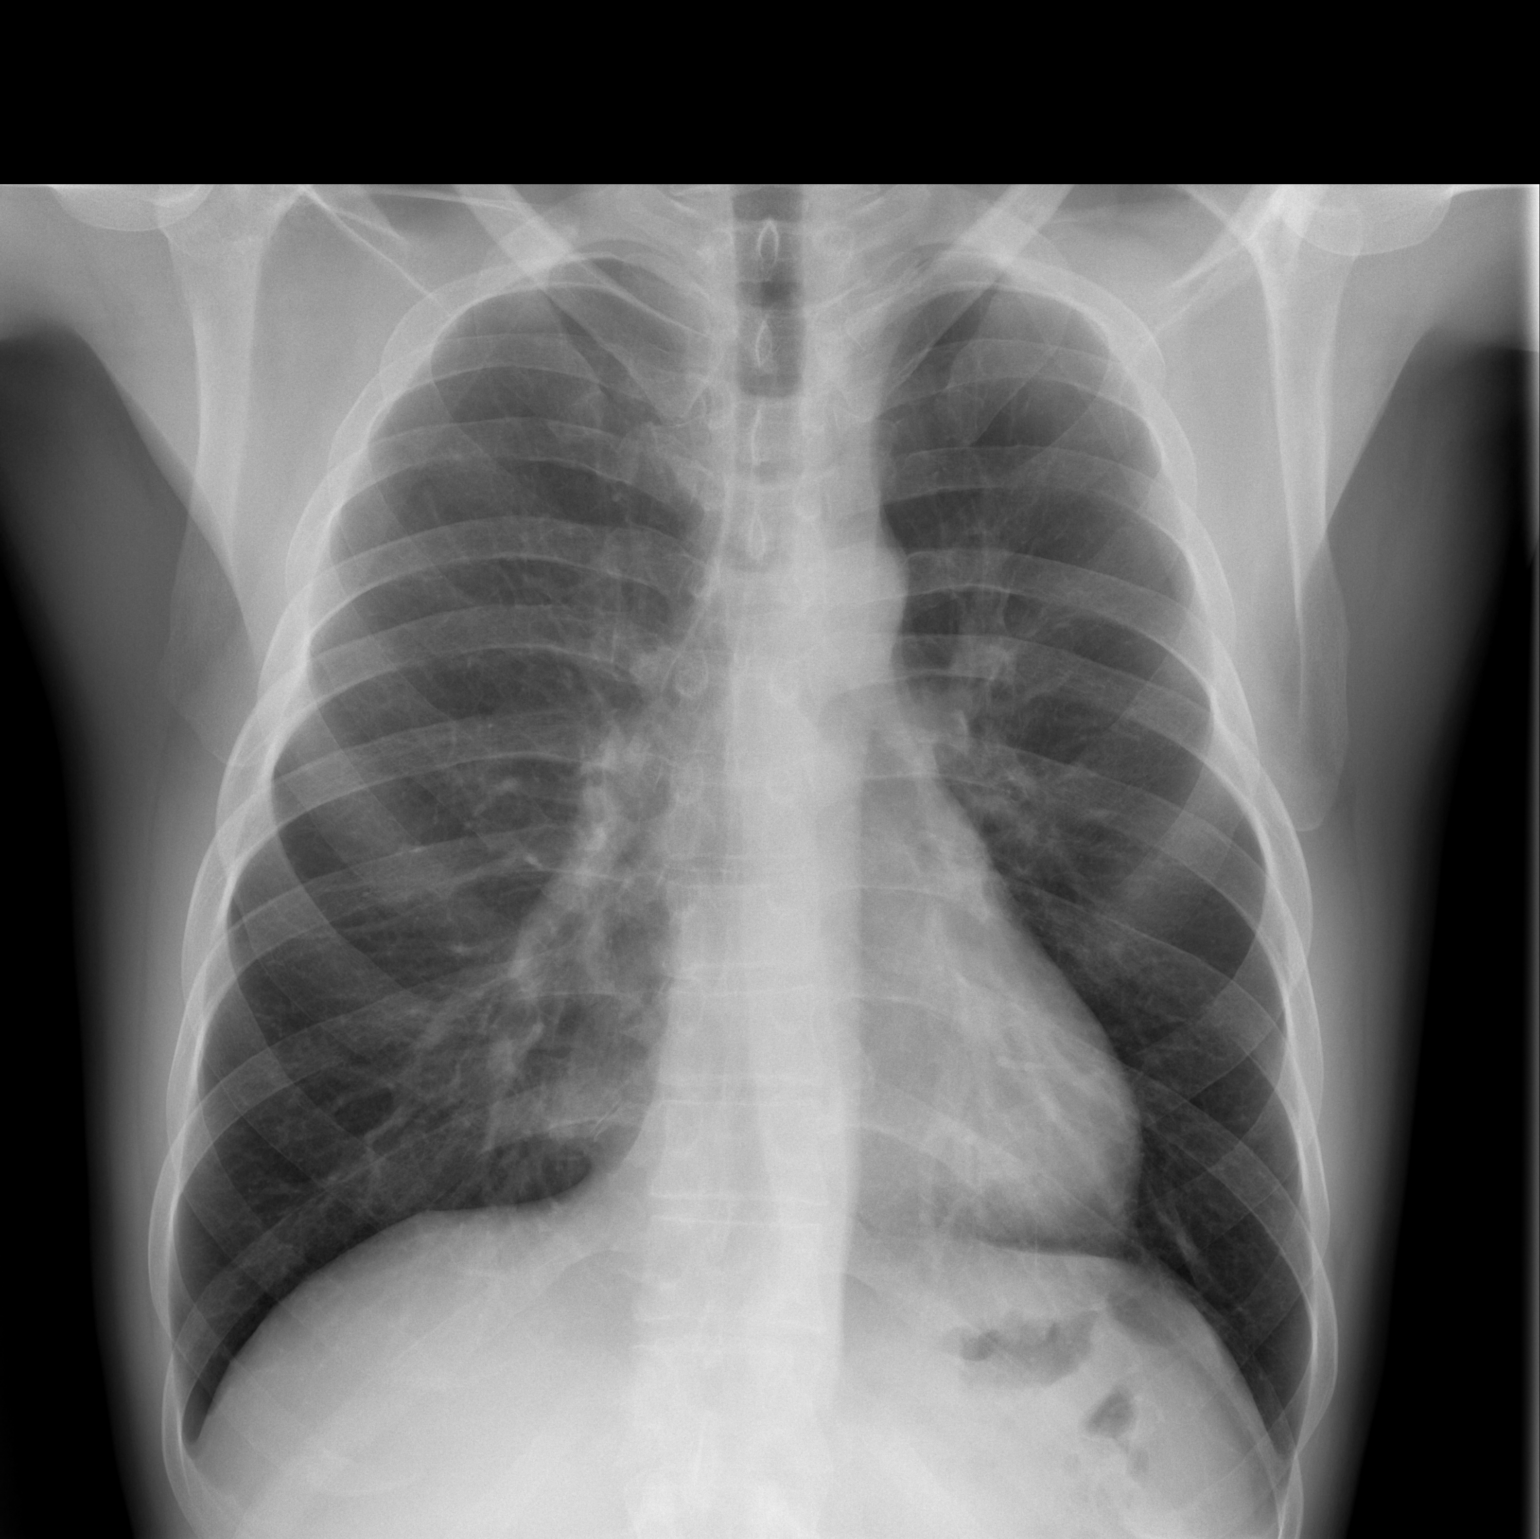

[w chest lat]
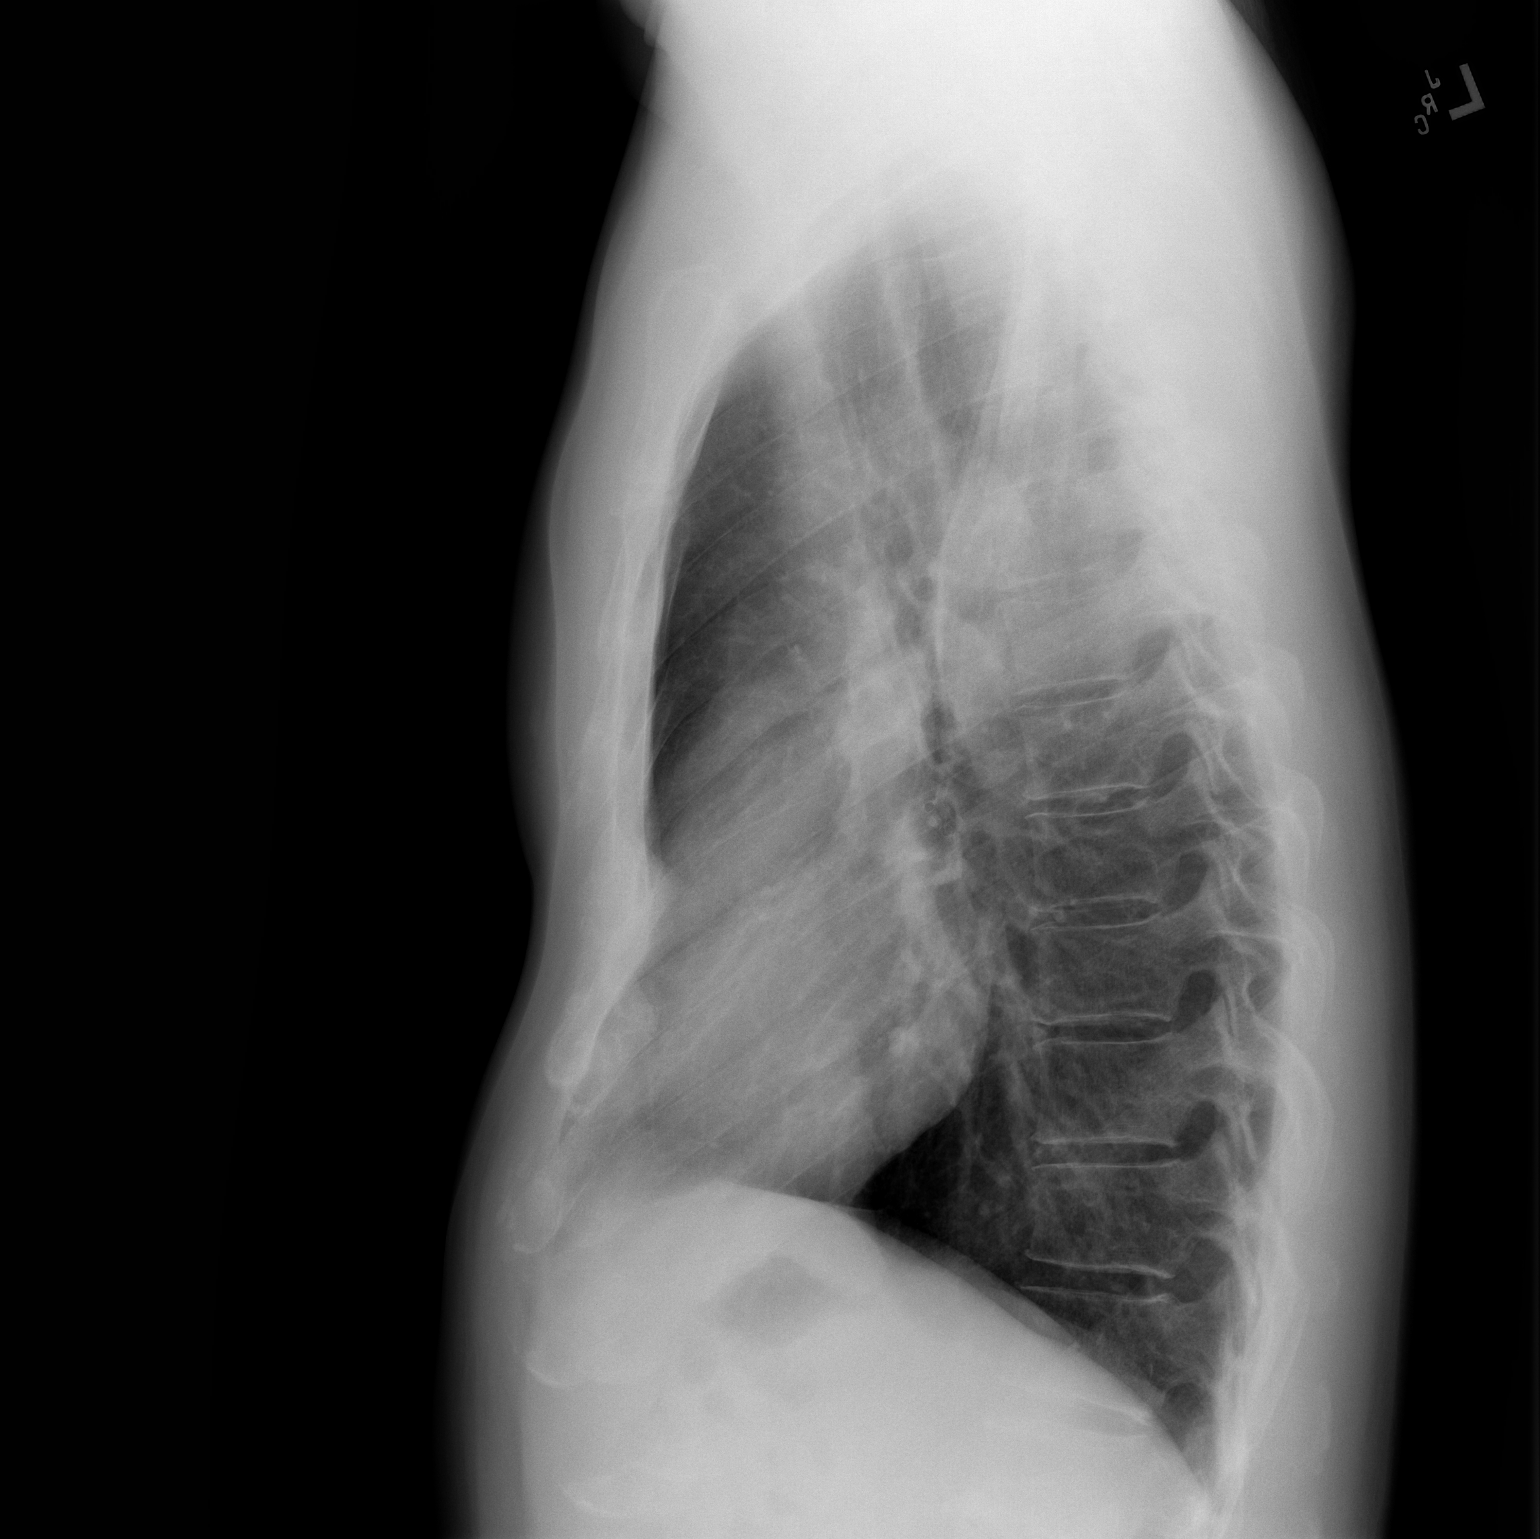

[2 of 2 positions shown; findings below may reference images not displayed]

FINDINGS: The heart size and mediastinal contours are within normal limits.
Both lungs are clear. Mild pectus excavatum again noted.
IMPRESSION: No active cardiopulmonary disease.

## 2023-03-09 ENCOUNTER — Ambulatory Visit (INDEPENDENT_AMBULATORY_CARE_PROVIDER_SITE_OTHER): Payer: BC Managed Care – PPO | Admitting: Internal Medicine

## 2023-03-09 ENCOUNTER — Encounter (HOSPITAL_COMMUNITY): Payer: Self-pay

## 2023-03-09 ENCOUNTER — Ambulatory Visit (HOSPITAL_BASED_OUTPATIENT_CLINIC_OR_DEPARTMENT_OTHER)
Admission: RE | Admit: 2023-03-09 | Discharge: 2023-03-09 | Disposition: A | Discharge status: Home / Self Care | Payer: BC Managed Care – PPO | Source: Ambulatory Visit | Attending: Internal Medicine | Admitting: Internal Medicine

## 2023-03-09 ENCOUNTER — Encounter: Payer: Self-pay | Admitting: Internal Medicine

## 2023-03-09 VITALS — BP 126/80 | HR 49 | Temp 98.0°F | Resp 16 | Ht 75.0 in | Wt 186.1 lb

## 2023-03-09 DIAGNOSIS — R0609 Other forms of dyspnea: Secondary | ICD-10-CM

## 2023-03-09 DIAGNOSIS — Z0001 Encounter for general adult medical examination with abnormal findings: Secondary | ICD-10-CM

## 2023-03-09 DIAGNOSIS — R079 Chest pain, unspecified: Secondary | ICD-10-CM

## 2023-03-09 DIAGNOSIS — Z125 Encounter for screening for malignant neoplasm of prostate: Secondary | ICD-10-CM | POA: Diagnosis not present

## 2023-03-09 DIAGNOSIS — Q676 Pectus excavatum: Secondary | ICD-10-CM | POA: Insufficient documentation

## 2023-03-09 DIAGNOSIS — E785 Hyperlipidemia, unspecified: Secondary | ICD-10-CM

## 2023-03-09 DIAGNOSIS — Z0000 Encounter for general adult medical examination without abnormal findings: Secondary | ICD-10-CM

## 2023-03-09 LAB — CBC WITH DIFFERENTIAL/PLATELET
Basophils Absolute: 0 10*3/uL (ref 0.0–0.1)
Basophils Relative: 0.5 % (ref 0.0–3.0)
Eosinophils Absolute: 0.2 10*3/uL (ref 0.0–0.7)
Eosinophils Relative: 3.4 % (ref 0.0–5.0)
HCT: 43.2 % (ref 39.0–52.0)
Hemoglobin: 15.1 g/dL (ref 13.0–17.0)
Lymphocytes Relative: 34.2 % (ref 12.0–46.0)
Lymphs Abs: 1.9 10*3/uL (ref 0.7–4.0)
MCHC: 34.9 g/dL (ref 30.0–36.0)
MCV: 85.3 fl (ref 78.0–100.0)
Monocytes Absolute: 0.4 10*3/uL (ref 0.1–1.0)
Monocytes Relative: 8.1 % (ref 3.0–12.0)
Neutro Abs: 3 10*3/uL (ref 1.4–7.7)
Neutrophils Relative %: 53.8 % (ref 43.0–77.0)
Platelets: 253 10*3/uL (ref 150.0–400.0)
RBC: 5.06 Mil/uL (ref 4.22–5.81)
RDW: 13.9 % (ref 11.5–15.5)
WBC: 5.5 10*3/uL (ref 4.0–10.5)

## 2023-03-09 LAB — COMPREHENSIVE METABOLIC PANEL
ALT: 11 U/L (ref 0–53)
AST: 13 U/L (ref 0–37)
Albumin: 4.2 g/dL (ref 3.5–5.2)
Alkaline Phosphatase: 45 U/L (ref 39–117)
BUN: 15 mg/dL (ref 6–23)
CO2: 29 mEq/L (ref 19–32)
Calcium: 9.1 mg/dL (ref 8.4–10.5)
Chloride: 103 mEq/L (ref 96–112)
Creatinine, Ser: 1.13 mg/dL (ref 0.40–1.50)
GFR: 75.95 mL/min (ref >60.00)
Glucose, Bld: 96 mg/dL (ref 70–99)
Potassium: 4.5 mEq/L (ref 3.5–5.1)
Sodium: 139 mEq/L (ref 135–145)
Total Bilirubin: 0.8 mg/dL (ref 0.2–1.2)
Total Protein: 6.7 g/dL (ref 6.0–8.3)

## 2023-03-09 LAB — LIPID PANEL
Cholesterol: 170 mg/dL (ref 0–200)
HDL: 46 mg/dL (ref >39.00)
LDL Cholesterol: 110 mg/dL — ABNORMAL HIGH (ref 0–99)
NonHDL: 124.27
Total CHOL/HDL Ratio: 4
Triglycerides: 69 mg/dL (ref 0.0–149.0)
VLDL: 13.8 mg/dL (ref 0.0–40.0)

## 2023-03-09 LAB — PSA: PSA: 1.35 ng/mL (ref 0.10–4.00)

## 2023-03-09 NOTE — Progress Notes (Unsigned)
Subjective:    Patient ID: Michael Sampson, male    DOB: November 09, 1971, 52 y.o.   MRN: [id number]  DOS:  03/09/2023 Type of visit - description: CPX  Here for CPX, he has some concerns: 1 year history of on and off pain between the scapulas, stabbing pain  but not severe.  May last an hour. This is associated with a mild discomfort at the left lower anterior chest wall.  No abdominal pain per se.  Also complaining of DOE, going on for a while. Mostly when he goes up or down stairs.  No associated cough, dizziness, diaphoresis or nausea.  No recent weight loss. When asked, admits to occasional SS CP different from pain as described above.   Review of Systems  Other than above, a 14 point review of systems is negative     Past Medical History:  Diagnosis Date   Calculus of kidney 10/02/2014   Hyperlipidemia    Mitral valve prolapse    no SBE prophylaxis    Past Surgical History:  Procedure Laterality Date   pectus carinatum     surgical correction at age 89   WISDOM TOOTH EXTRACTION     Social History   Socioeconomic History   Marital status: Married    Spouse name: Not on file   Number of children: 2   Years of education: Not on file   Highest education level: Not on file  Occupational History   Occupation: Nurse, learning disability   Tobacco Use   Smoking status: Never   Smokeless tobacco: Never  Vaping Use   Vaping Use: Never used  Substance and Sexual Activity   Alcohol use: No    Alcohol/week: 0.0 standard drinks of alcohol   Drug use: No   Sexual activity: Not on file  Other Topics Concern   Not on file  Social History Narrative   Household-- pt , wife and 2 children 2003, 2010   Social Determinants of Corporate investment banker Strain: Not on file  Food Insecurity: Not on file  Transportation Needs: Not on file  Physical Activity: Not on file  Stress: Not on file  Social Connections: Not on file  Intimate Partner Violence: Not on file    Current  Outpatient Medications  Medication Instructions   Ibuprofen 200 MG CAPS 2 capsules, Oral, Weekly       Objective:   Physical Exam BP 126/80   Pulse (!) 49   Temp 98 F (36.7 C) (Oral)   Resp 16   Ht 6\' 3"  (1.905 m)   Wt 186 lb 2 oz (84.4 kg)   SpO2 93%   BMI 23.26 kg/m  General:   Well developed, NAD, BMI noted.  HEENT:  Normocephalic . Face symmetric, atraumatic Lungs:  CTA B Normal respiratory effort, no intercostal retractions, no accessory muscle use. Heart: RRR,  no murmur.  Abdomen:  Not distended, soft, non-tender. No rebound or rigidity. DRE: Normal sphincter tone, brown stools, prostate normal. Skin: Not pale. Not jaundice Lower extremities: no pretibial edema bilaterally  Neurologic:  alert & oriented X3.  Speech normal, gait appropriate for age and unassisted Psych--  Cognition and judgment appear intact.  Cooperative with normal attention span and concentration.  Behavior appropriate. No anxious or depressed appearing.     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  Will get chest x-ray. DOE, SS CP: In addition to thoracic pain, he reports some dyspnea on exertion without cough or wheezing and at times associated with substernal discomfort. EKG today unchanged from previous.  Bradycardic. His overall cardiovascular risk factor is low but with a new onset of DOE and chest pain I recommend a Myoview and labs. Reassess in 3 months.

## 2023-03-09 NOTE — Patient Instructions (Addendum)
Vaccines I recommend:  Shingrix (shingles) COVID vaccine  Will schedule a stress test  GO TO THE LAB : Get the blood work     GO TO THE FRONT DESK, PLEASE SCHEDULE YOUR APPOINTMENTS Come back for a checkup in 3 months  STOP BY THE FIRST FLOOR:  get the XR

## 2023-03-10 ENCOUNTER — Encounter: Payer: Self-pay | Admitting: Internal Medicine

## 2023-03-10 NOTE — Assessment & Plan Note (Signed)
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  Will get chest x-ray. DOE, SS CP: In addition to thoracic pain, he reports some dyspnea on exertion without cough or wheezing and at times associated with substernal discomfort. EKG today unchanged from previous.  Bradycardic. His overall cardiovascular risk factor is low but with a new onset of DOE and chest pain I recommend a Myoview and labs. Reassess in 3 months.

## 2023-03-10 NOTE — Assessment & Plan Note (Signed)
-   Td 2018 - Shingrix recommended -COVID VAX: pros> cons  -CCS: cscope 05/2021, next 2029 per GI letter   -Prostate cancer screening: No SX, DRE negative, check PSA -Screening for  AAA, FH of aortic dissection: [place] Abd Aorta 09-2018 and 03-2021 both NEG ; echo 02-2021: Ao wnl -Labs: CMP FLP CBC PSA chest x-ray -Diet: Discussed

## 2023-03-16 ENCOUNTER — Telehealth (HOSPITAL_COMMUNITY): Payer: Self-pay | Admitting: Internal Medicine

## 2023-03-16 NOTE — Telephone Encounter (Signed)
Noted! Thank you

## 2023-03-16 NOTE — Telephone Encounter (Signed)
Just an FYI. We have made several attempts to contact this patient including sending a letter to schedule or reschedule their Myoview. We will be removing the patient from the echo/nuc WQ.   03/09/23 My Chart message sent to call office to schedule @ 2:15/LBW  03/09/23 called and NA x 1 @ 2:14/LBW          Thank you

## 2023-06-15 ENCOUNTER — Ambulatory Visit: Payer: BC Managed Care – PPO | Admitting: Internal Medicine

## 2023-06-15 ENCOUNTER — Encounter: Payer: Self-pay | Admitting: Internal Medicine

## 2023-06-15 VITALS — BP 109/69 | HR 55 | Temp 97.7°F | Resp 16 | Ht 75.0 in | Wt 181.5 lb

## 2023-06-15 DIAGNOSIS — R079 Chest pain, unspecified: Secondary | ICD-10-CM

## 2023-06-15 DIAGNOSIS — R0789 Other chest pain: Secondary | ICD-10-CM | POA: Diagnosis not present

## 2023-06-15 NOTE — Patient Instructions (Addendum)
We are referring you again for a stress test, if you will hear from them in 2 weeks please let me know  We are referring you to orthopedic doctor regards to pain of the left side of the chest, I think it is possibly a muscle skeletal issue.  Vaccines I recommend: Shingrix (shingles) Flu shot this fall   Schedule a follow-up with me in 4 months from today

## 2023-06-15 NOTE — Progress Notes (Unsigned)
   Subjective:    Patient ID: Michael Sampson, male    DOB: [birth year], 52 y.o.   MRN: [id number]  DOS:  06/15/2023 Type of visit - description: Follow-up See LOV, symptoms started over a year ago, they are essentially unchanged for the last few months: DOE, pain between the scapula as, sometimes discomfort at the L lower anterior chest. Occasional L ASCP.    Review of Systems See above   Past Medical History:  Diagnosis Date   Calculus of kidney 10/02/2014   Hyperlipidemia    Mitral valve prolapse    no SBE prophylaxis    Past Surgical History:  Procedure Laterality Date   pectus carinatum     surgical correction at age 36   WISDOM TOOTH EXTRACTION      Current Outpatient Medications  Medication Instructions   Ibuprofen 200 MG CAPS 2 capsules, Oral, Weekly       Objective:   Physical Exam BP 109/69   Pulse (!) 55   Temp 97.7 F (36.5 C) (Oral)   Resp 16   Ht 6\' 3"  (1.905 m)   Wt 181 lb 8 oz (82.3 kg)   SpO2 95%   BMI 22.69 kg/m  General:   Well developed, NAD, BMI noted. HEENT:  Normocephalic . Face symmetric, atraumatic Lungs:  CTA B Normal respiratory effort, no intercostal retractions, no accessory muscle use. Heart: RRR,  no murmur. No TTP of the thoracic spine Lower extremities: no pretibial edema bilaterally  Skin: Not pale. Not jaundice Neurologic:  alert & oriented X3.  Speech normal, gait appropriate for age and unassisted Psych--  Cognition and judgment appear intact.  Cooperative with normal attention span and concentration.  Behavior appropriate. No anxious or depressed appearing.      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 DOE, SS CP Labs was satisfactory, cardiovascular risk at 10 years: 2.9%. Was not able to  schedule my review, will try again, recommend to contact me in 2 weeks if that is not scheduled. RTC 4 months

## 2023-06-17 NOTE — Assessment & Plan Note (Signed)
Thoracic pain: See LOV, chest x-ray negative, possibly a MSK issue, refer to Ortho. DOE, SS CP Labs was satisfactory, cardiovascular risk at 10 years: 2.9%. Was not able to schedule a stress test , will try again, recommend to contact me in 2 weeks if that is not scheduled. RTC 4 months

## 2023-06-22 ENCOUNTER — Encounter (HOSPITAL_COMMUNITY): Payer: Self-pay | Admitting: Internal Medicine

## 2023-06-29 ENCOUNTER — Ambulatory Visit: Payer: BC Managed Care – PPO | Admitting: Orthopedic Surgery

## 2023-07-11 ENCOUNTER — Encounter (HOSPITAL_COMMUNITY): Payer: Self-pay

## 2023-07-20 ENCOUNTER — Encounter (HOSPITAL_COMMUNITY): Payer: BC Managed Care – PPO

## 2023-09-28 ENCOUNTER — Encounter (HOSPITAL_COMMUNITY): Payer: Self-pay

## 2023-10-05 ENCOUNTER — Encounter (HOSPITAL_COMMUNITY): Payer: BC Managed Care – PPO

## 2023-10-26 ENCOUNTER — Ambulatory Visit: Payer: BC Managed Care – PPO | Admitting: Internal Medicine

## 2023-11-22 ENCOUNTER — Encounter (HOSPITAL_COMMUNITY): Payer: Self-pay

## 2023-11-30 ENCOUNTER — Ambulatory Visit (HOSPITAL_COMMUNITY): Payer: BC Managed Care – PPO | Attending: Internal Medicine

## 2023-11-30 DIAGNOSIS — R079 Chest pain, unspecified: Secondary | ICD-10-CM | POA: Insufficient documentation

## 2023-11-30 LAB — MYOCARDIAL PERFUSION IMAGING
Angina Index: 0
Estimated workload: 11.7
Exercise duration (min): 10 min
Exercise duration (sec): 1 s
LV dias vol: 79 mL (ref 62–150)
LV sys vol: 37 mL
MPHR: 170 {beats}/min
Nuc Stress EF: 54 %
Peak HR: 164 {beats}/min
Percent HR: 96 %
Rest HR: 77 {beats}/min
Rest Nuclear Isotope Dose: 10.1 mCi
SDS: 0
SRS: 0
SSS: 0
Stress Nuclear Isotope Dose: 32.4 mCi
TID: 1.15

## 2023-11-30 MED ORDER — TECHNETIUM TC 99M TETROFOSMIN IV KIT
10.1000 | PACK | Freq: Once | INTRAVENOUS | Status: AC | PRN
  Administered 2023-11-30: 10.1 via INTRAVENOUS

## 2023-11-30 MED ORDER — TECHNETIUM TC 99M TETROFOSMIN IV KIT
32.4000 | PACK | Freq: Once | INTRAVENOUS | Status: AC | PRN
  Administered 2023-11-30: 32.4 via INTRAVENOUS

## 2023-12-02 ENCOUNTER — Encounter: Payer: Self-pay | Admitting: Internal Medicine

## 2023-12-14 ENCOUNTER — Ambulatory Visit: Payer: BC Managed Care – PPO | Admitting: Internal Medicine

## 2024-01-25 ENCOUNTER — Ambulatory Visit: Payer: BC Managed Care – PPO | Admitting: Internal Medicine

## 2024-02-15 DIAGNOSIS — Z8249 Family history of ischemic heart disease and other diseases of the circulatory system: Secondary | ICD-10-CM | POA: Insufficient documentation

## 2024-02-24 ENCOUNTER — Emergency Department (HOSPITAL_BASED_OUTPATIENT_CLINIC_OR_DEPARTMENT_OTHER)

## 2024-02-24 ENCOUNTER — Emergency Department (HOSPITAL_BASED_OUTPATIENT_CLINIC_OR_DEPARTMENT_OTHER)
Admission: EM | Admit: 2024-02-24 | Discharge: 2024-02-24 | Disposition: A | Discharge status: Home / Self Care | Attending: Emergency Medicine | Admitting: Emergency Medicine

## 2024-02-24 ENCOUNTER — Other Ambulatory Visit: Payer: Self-pay

## 2024-02-24 ENCOUNTER — Encounter (HOSPITAL_BASED_OUTPATIENT_CLINIC_OR_DEPARTMENT_OTHER): Payer: Self-pay | Admitting: Emergency Medicine

## 2024-02-24 DIAGNOSIS — E119 Type 2 diabetes mellitus without complications: Secondary | ICD-10-CM | POA: Insufficient documentation

## 2024-02-24 DIAGNOSIS — R0789 Other chest pain: Secondary | ICD-10-CM | POA: Insufficient documentation

## 2024-02-24 DIAGNOSIS — R079 Chest pain, unspecified: Secondary | ICD-10-CM

## 2024-02-24 DIAGNOSIS — I10 Essential (primary) hypertension: Secondary | ICD-10-CM | POA: Diagnosis not present

## 2024-02-24 LAB — COMPREHENSIVE METABOLIC PANEL WITH GFR
ALT: 15 U/L (ref 0–44)
AST: 14 U/L — ABNORMAL LOW (ref 15–41)
Albumin: 4.1 g/dL (ref 3.5–5.0)
Alkaline Phosphatase: 51 U/L (ref 38–126)
Anion gap: 7 (ref 5–15)
BUN: 18 mg/dL (ref 6–20)
CO2: 27 mmol/L (ref 22–32)
Calcium: 9.4 mg/dL (ref 8.9–10.3)
Chloride: 103 mmol/L (ref 98–111)
Creatinine, Ser: 1 mg/dL (ref 0.61–1.24)
GFR, Estimated: >60 mL/min (ref >60)
Glucose, Bld: 108 mg/dL — ABNORMAL HIGH (ref 70–99)
Potassium: 3.9 mmol/L (ref 3.5–5.1)
Sodium: 137 mmol/L (ref 135–145)
Total Bilirubin: 0.9 mg/dL (ref 0.0–1.2)
Total Protein: 7.4 g/dL (ref 6.5–8.1)

## 2024-02-24 LAB — LIPASE, BLOOD: Lipase: 36 U/L (ref 11–51)

## 2024-02-24 LAB — CBC
HCT: 43.1 % (ref 39.0–52.0)
Hemoglobin: 14.9 g/dL (ref 13.0–17.0)
MCH: 29 pg (ref 26.0–34.0)
MCHC: 34.6 g/dL (ref 30.0–36.0)
MCV: 84 fL (ref 80.0–100.0)
Platelets: 287 10*3/uL (ref 150–400)
RBC: 5.13 MIL/uL (ref 4.22–5.81)
RDW: 12.8 % (ref 11.5–15.5)
WBC: 9.2 10*3/uL (ref 4.0–10.5)
nRBC: 0 % (ref 0.0–0.2)

## 2024-02-24 LAB — TROPONIN I (HIGH SENSITIVITY)
Troponin I (High Sensitivity): <2 ng/L (ref <18)
Troponin I (High Sensitivity): <2 ng/L (ref <18)

## 2024-02-24 MED ORDER — IOHEXOL 350 MG/ML SOLN
100.0000 mL | Freq: Once | INTRAVENOUS | Status: AC | PRN
  Administered 2024-02-24: 100 mL via INTRAVENOUS

## 2024-02-24 MED ORDER — SODIUM CHLORIDE 0.9 % IV BOLUS
1000.0000 mL | Freq: Once | INTRAVENOUS | Status: AC
  Administered 2024-02-24: 1000 mL via INTRAVENOUS

## 2024-02-24 MED ORDER — ALUM & MAG HYDROXIDE-SIMETH 200-200-20 MG/5ML PO SUSP
30.0000 mL | Freq: Once | ORAL | Status: AC
  Administered 2024-02-24: 30 mL via ORAL
  Filled 2024-02-24: qty 30

## 2024-02-24 MED ORDER — PANTOPRAZOLE SODIUM 20 MG PO TBEC: 20.0000 mg | DELAYED_RELEASE_TABLET | Freq: Every day | ORAL | 0 refills | Status: AC

## 2024-02-24 MED ORDER — ONDANSETRON HCL 4 MG/2ML IJ SOLN
4.0000 mg | Freq: Once | INTRAMUSCULAR | Status: AC
  Administered 2024-02-24: 4 mg via INTRAVENOUS
  Filled 2024-02-24: qty 2

## 2024-02-24 MED ORDER — PANTOPRAZOLE SODIUM 40 MG PO TBEC
40.0000 mg | DELAYED_RELEASE_TABLET | Freq: Once | ORAL | Status: AC
  Administered 2024-02-24: 40 mg via ORAL
  Filled 2024-02-24: qty 1

## 2024-02-24 MED ORDER — ONDANSETRON 4 MG PO TBDP: 4.0000 mg | ORAL_TABLET | Freq: Three times a day (TID) | ORAL | 0 refills | Status: AC | PRN

## 2024-02-24 MED ORDER — ALUM & MAG HYDROXIDE-SIMETH 200-200-20 MG/5ML PO SUSP: 30.0000 mL | Freq: Once | ORAL | Status: DC

## 2024-02-24 MED ORDER — SUCRALFATE 1 G PO TABS: 1.0000 g | ORAL_TABLET | Freq: Three times a day (TID) | ORAL | 0 refills | Status: AC

## 2024-02-24 NOTE — ED Notes (Signed)
 Patient transported to CT via stretcher.

## 2024-02-24 NOTE — ED Provider Notes (Signed)
 [place] [facility] AT [facility] Provider Note   CSN: [id number] Arrival date & time: 02/24/24  0901     History  Chief Complaint  Patient presents with   Chest Pain    Michael Sampson is a 53 y.o. male.  Patient here with chest pain going into his back on and off for the last few days worse this morning.  Significant medical history.  Does not take medicine for cholesterol diabetes hypertension.  Pain mostly in the upper abdomen into his upper chest radiates into his upper back.  Denies any weakness numbness tingling.  No headache.  He is felt very weak this morning as well.  Has felt nauseous.  No significant family history except for history of aneurysms.  Father had aortic aneurysm.  He denies any prior screening for this.  No pain with urination.  No alcohol use no drug use no smoking history.  The history is provided by the patient.       Home Medications Prior to Admission medications   Medication Sig Start Date End Date Taking? Authorizing Provider  ondansetron (ZOFRAN-ODT) 4 MG disintegrating tablet Take 1 tablet (4 mg total) by mouth every 8 (eight) hours as needed. 02/24/24  Yes Ember Henrikson, DO  pantoprazole (PROTONIX) 20 MG tablet Take 1 tablet (20 mg total) by mouth daily for 14 days. 02/24/24 03/09/24 Yes Faizan Geraci, DO  sucralfate (CARAFATE) 1 g tablet Take 1 tablet (1 g total) by mouth 4 (four) times daily -  with meals and at bedtime for 14 days. 02/24/24 03/09/24 Yes Marijean Montanye, DO      Allergies    Patient has no known allergies.    Review of Systems   Review of Systems  Physical Exam Updated Vital Signs BP 120/75 (BP Location: Right Arm)   Pulse 70   Temp 98.1 F (36.7 C) (Oral)   Resp (!) 24   Ht 6\' 3"  (1.905 m)   Wt 80.7 kg   SpO2 100%   BMI 22.25 kg/m  Physical Exam Vitals and nursing note reviewed.  Constitutional:      General: He is not in acute distress.    Appearance: He is well-developed. He is  ill-appearing and diaphoretic.  HENT:     Head: Normocephalic and atraumatic.  Eyes:     Extraocular Movements: Extraocular movements intact.     Conjunctiva/sclera: Conjunctivae normal.     Pupils: Pupils are equal, round, and reactive to light.  Cardiovascular:     Rate and Rhythm: Normal rate and regular rhythm.     Pulses:          Radial pulses are 2+ on the right side and 2+ on the left side.     Heart sounds: Normal heart sounds. No murmur heard. Pulmonary:     Effort: Pulmonary effort is normal. No respiratory distress.     Breath sounds: Normal breath sounds. No decreased breath sounds or wheezing.  Abdominal:     Palpations: Abdomen is soft.     Tenderness: There is abdominal tenderness in the epigastric area.  Musculoskeletal:        General: No swelling. Normal range of motion.     Cervical back: Normal range of motion and neck supple.  Skin:    General: Skin is warm.     Capillary Refill: Capillary refill takes less than 2 seconds.  Neurological:     Mental Status: He is alert.  Psychiatric:  Mood and Affect: Mood normal.     ED Results / Procedures / Treatments   Labs (all labs ordered are listed, but only abnormal results are displayed) Labs Reviewed  COMPREHENSIVE METABOLIC PANEL WITH GFR - Abnormal; Notable for the following components:      Result Value   Glucose, Bld 108 (*)    AST 14 (*)    All other components within normal limits  CBC  LIPASE, BLOOD  TROPONIN I (HIGH SENSITIVITY)  TROPONIN I (HIGH SENSITIVITY)    EKG EKG Interpretation Date/Time:  Sunday February 24 2024 09:15:07 EDT Ventricular Rate:  71 PR Interval:  203 QRS Duration:  97 QT Interval:  369 QTC Calculation: 401 R Axis:   95  Text Interpretation: Sinus rhythm Confirmed by Lowery Rue [phone]) on 02/24/2024 9:18:56 AM  Radiology CT Angio Chest/Abd/Pel for Dissection W and/or Wo Contrast Result Date: 02/24/2024 CLINICAL DATA:  Chest pain which radiates ribs. Concern  for acute aortic syndrome. EXAM: CT ANGIOGRAPHY CHEST, ABDOMEN AND PELVIS TECHNIQUE: Non-contrast CT of the chest was initially obtained. Multidetector CT imaging through the chest, abdomen and pelvis was performed using the standard protocol during bolus administration of intravenous contrast. Multiplanar reconstructed images and MIPs were obtained and reviewed to evaluate the vascular anatomy. RADIATION DOSE REDUCTION: This exam was performed according to the departmental dose-optimization program which includes automated exposure control, adjustment of the mA and/or kV according to patient size and/or use of iterative reconstruction technique. CONTRAST:  100mL OMNIPAQUE IOHEXOL 350 MG/ML SOLN COMPARISON:  None Available. FINDINGS: CTA CHEST FINDINGS Cardiovascular: No intramural hematoma identified within the thoracic aorta on noncontrast imaging. Contrast series demonstrates no aortic dissection or aneurysm. Great vessels are normal. No pulmonary embolism. Mediastinum/Nodes: No axillary or supraclavicular adenopathy. No mediastinal or hilar adenopathy. No pericardial fluid. Esophagus normal. Lungs/Pleura: No pulmonary infarction. No pneumonia. No pleural fluid. No pneumothorax Musculoskeletal: No acute osseous abnormality. Review of the MIP images confirms the above findings. CTA ABDOMEN AND PELVIS FINDINGS VASCULAR Aorta: Normal caliber aorta without aneurysm, dissection, vasculitis or significant stenosis. Celiac: Patent without evidence of aneurysm, dissection, vasculitis or significant stenosis. SMA: Patent without evidence of aneurysm, dissection, vasculitis or significant stenosis. Renals: Both renal arteries are patent without evidence of aneurysm, dissection, vasculitis, fibromuscular dysplasia or significant stenosis. IMA: Patent without evidence of aneurysm, dissection, vasculitis or significant stenosis. Inflow: Patent without evidence of aneurysm, dissection, vasculitis or significant stenosis.  Veins: No obvious venous abnormality within the limitations of this arterial phase study. Review of the MIP images confirms the above findings. NON-VASCULAR Hepatobiliary: No focal hepatic lesion. Gallbladder is nondistended. No echogenic gallstones. There is rounded low-density tissue in the fundus of the gallbladder measuring 19 mm (172/series 30). No biliary duct dilatation. Common bile duct is normal. Pancreas: Pancreas is normal. No ductal dilatation. No pancreatic inflammation. Spleen: Normal spleen Adrenals/urinary tract: Adrenal glands and kidneys are normal. The ureters and bladder normal. Stomach/Bowel: Stomach, small bowel, appendix, and cecum are normal. The colon and rectosigmoid colon are normal. Vascular/Lymphatic: Abdominal aorta is normal caliber. No periportal or retroperitoneal adenopathy. No pelvic adenopathy. Reproductive: Prostate normal Other: No free fluid. Musculoskeletal: No aggressive osseous lesion. Review of the MIP images confirms the above findings. IMPRESSION: CHEST: 1. No aortic dissection or aneurysm. 2. No pulmonary embolism. 3. No acute pulmonary findings. PELVIS: 1. No aortic dissection or aneurysm. 2. No acute findings in the abdomen pelvis. 3. Probable 2 cm cholesterol stone in the fundus of the gallbladder. Electronically Signed   By:  Deboraha Fallow M.D.   On: 02/24/2024 10:56    Procedures Procedures    Medications Ordered in ED Medications  sodium chloride 0.9 % bolus 1,000 mL (0 mLs Intravenous Stopped 02/24/24 1137)  iohexol (OMNIPAQUE) 350 MG/ML injection 100 mL (100 mLs Intravenous Contrast Given 02/24/24 1004)  ondansetron (ZOFRAN) injection 4 mg (4 mg Intravenous Given 02/24/24 1054)  alum & mag hydroxide-simeth (MAALOX/MYLANTA) 200-200-20 MG/5ML suspension 30 mL (30 mLs Oral Given 02/24/24 1137)  pantoprazole (PROTONIX) EC tablet 40 mg (40 mg Oral Given 02/24/24 1137)    ED Course/ Medical Decision Making/ A&P                                 Medical  Decision Making Amount and/or Complexity of Data Reviewed Labs: ordered. Radiology: ordered.  Risk OTC drugs. Prescription drug management.   Michael Sampson is here with chest pain.  Normal vitals.  No fever.  No major cardiac risk factors.  Family history of aneurysms including his father.  He is never been screened for 1.  He he is diaphoretic on exam.  His EKG shows sinus rhythm.  No ischemic changes per my review interpretation of labs.  Ultimately differential does include dissection seems less likely to be ACS could be pancreatitis cholecystitis GI related process as well.  Will send for dissection study and I have contacted CT to get him next.  CBC CMP lipase troponin has been ordered.  Will give fluid bolus.  Troponin negative x 2.  No significant anemia electrolyte abnormality kidney injury or leukocytosis.  Gallbladder liver enzymes within normal limits.  Lipase normal.  CT dissection studies unremarkable.  No acute findings.  May be cholesterol stone in the fundus of the gallbladder but ultimately no dissection no aneurysm.  No PE.  Overall no evidence of coronary artery disease.  Feeling better after GI cocktail, fluids Zofran.  I do suspect may be esophagitis gastritis may be ulcer but not having any bleeding.  Understands return precautions.  Recommend close follow-up with primary care doctor for reevaluation.  Discharged in good condition.  This chart was dictated using voice recognition software.  Despite best efforts to proofread,  errors can occur which can change the documentation meaning.         Final Clinical Impression(s) / ED Diagnoses Final diagnoses:  Nonspecific chest pain    Rx / DC Orders ED Discharge Orders          Ordered    pantoprazole (PROTONIX) 20 MG tablet  Daily        02/24/24 1140    ondansetron (ZOFRAN-ODT) 4 MG disintegrating tablet  Every 8 hours PRN        02/24/24 1140    sucralfate (CARAFATE) 1 g tablet  3 times daily with meals &  bedtime        02/24/24 1140              Addilynne Olheiser, DO 02/24/24 1141

## 2024-02-24 NOTE — ED Triage Notes (Addendum)
 Chest pain since Wednesday radiates to ribs, more on right.  No sob.  No n/v.  No known fever.  No cough.  Pt admits to acid reflux.

## 2024-03-14 ENCOUNTER — Encounter: Payer: BC Managed Care – PPO | Admitting: Internal Medicine

## 2024-04-03 NOTE — Progress Notes (Signed)
 Chief Complaint: GERD and gallstones Primary GI MD: Dr. Karene Oto  HPI: Discussed the use of AI scribe software for clinical note transcription with the patient, who gave verbal consent to proceed.  History of Present Illness Michael Sampson is a 53 year old male who presents with gastrointestinal symptoms.  Approximately one month ago, he experienced chest pain radiating through his ribs, prompting a visit to the emergency room due to concerns of a heart attack. A CT scan revealed a 2 cm gallstone in the fundus of the gallbladder. His family history of aortic dissection heightened his concern about the chest pain. Negative cardiac workup. No further "heart attack" feeling since.  He experiences sharp abdominal pain and loose stools, described as a 'blowout,' after consuming greasy foods. He also has heartburn, which he attributes to his dietary habits, including frequent consumption of soda and fast food. He takes Protonix  in the morning, which provides some relief, but not completely. He also takes Carafate , though he is unsure of its effectiveness.  He has noticed a decrease in his ability to eat without discomfort, leading to a fear of eating and a weight loss of about ten pounds since January. No regular use of ibuprofen, rectal bleeding, or black stools.    PREVIOUS GI WORKUP   Colonoscopy 05/2021 - Three 2 to 5 mm polyps (tubular adenoma x 1, hyperplastic x 2) in the sigmoid colon and in the descending colon, removed with a cold snare. Resected and retrieved. - The distal rectum and anal verge are normal on retroflexion view. - Repeat 7 years  Past Medical History:  Diagnosis Date   Calculus of kidney 10/02/2014   Hyperlipidemia    Mitral valve prolapse    no SBE prophylaxis    Past Surgical History:  Procedure Laterality Date   pectus carinatum     surgical correction at age 32   WISDOM TOOTH EXTRACTION      Current Outpatient Medications  Medication Sig Dispense  Refill   ibuprofen (ADVIL) 200 MG tablet Take 200-600 mg by mouth as needed.     pantoprazole  (PROTONIX ) 20 MG tablet Take 1 tablet (20 mg total) by mouth daily for 14 days. 14 tablet 0   sucralfate  (CARAFATE ) 1 g tablet Take 1 tablet (1 g total) by mouth 4 (four) times daily -  with meals and at bedtime for 14 days. 56 tablet 0   ondansetron  (ZOFRAN -ODT) 4 MG disintegrating tablet Take 1 tablet (4 mg total) by mouth every 8 (eight) hours as needed. (Patient not taking: Reported on 04/04/2024) 20 tablet 0   No current facility-administered medications for this visit.    Allergies as of 04/04/2024   (No Known Allergies)    Family History  Problem Relation Age of Onset   Heart murmur Mother    Hyperthyroidism Mother        S/P RAI   Hyperlipidemia Father    COPD Father    Aortic dissection Father    Graves' disease Sister    Diabetes Neg Hx    Stroke Neg Hx    Cancer Neg Hx    Heart disease Neg Hx    Colon cancer Neg Hx    Colon polyps Neg Hx    Esophageal cancer Neg Hx    Rectal cancer Neg Hx    Stomach cancer Neg Hx    Prostate cancer Neg Hx     Social History   Socioeconomic History   Marital status: Married    Spouse  name: Not on file   Number of children: 2   Years of education: Not on file   Highest education level: Not on file  Occupational History   Occupation: Nurse, learning disability   Tobacco Use   Smoking status: Never   Smokeless tobacco: Never  Vaping Use   Vaping status: Never Used  Substance and Sexual Activity   Alcohol use: No    Alcohol/week: 0.0 standard drinks of alcohol   Drug use: No   Sexual activity: Not on file  Other Topics Concern   Not on file  Social History Narrative   Household-- pt , wife and 2 children 2003, 2010   Social Drivers of Corporate investment banker Strain: Not on Ship broker Insecurity: Not on file  Transportation Needs: Not on file  Physical Activity: Not on file  Stress: Not on file  Social Connections: Not on file   Intimate Partner Violence: Not on file    Review of Systems:    Constitutional: No weight loss, fever, chills, weakness or fatigue HEENT: Eyes: No change in vision               Ears, Nose, Throat:  No change in hearing or congestion Skin: No rash or itching Cardiovascular: No chest pain, chest pressure or palpitations   Respiratory: No SOB or cough Gastrointestinal: See HPI and otherwise negative Genitourinary: No dysuria or change in urinary frequency Neurological: No headache, dizziness or syncope Musculoskeletal: No new muscle or joint pain Hematologic: No bleeding or bruising Psychiatric: No history of depression or anxiety    Physical Exam:  Vital signs: BP 122/80 (BP Location: Left Arm, Patient Position: Sitting, Cuff Size: Normal)   Pulse 60   Ht 6' (1.829 m) Comment: Height measured without shoes  Wt 169 lb (76.7 kg)   BMI 22.92 kg/m   Constitutional: NAD, alert and cooperative Head:  Normocephalic and atraumatic. Eyes:   PEERL, EOMI. No icterus. Conjunctiva pink. Respiratory: Respirations even and unlabored. Lungs clear to auscultation bilaterally.   No wheezes, crackles, or rhonchi.  Cardiovascular:  Regular rate and rhythm. No peripheral edema, cyanosis or pallor.  Gastrointestinal:  Soft, nondistended, nontender. No rebound or guarding. Normal bowel sounds. No appreciable masses or hepatomegaly. Rectal:  Declines Msk:  Symmetrical without gross deformities. Without edema, no deformity or joint abnormality.  Neurologic:  Alert and  oriented x4;  grossly normal neurologically.  Skin:   Dry and intact without significant lesions or rashes. Psychiatric: Oriented to person, place and time. Demonstrates good judgement and reason without abnormal affect or behaviors.  Physical Exam ABDOMEN: Normal bowel sounds. No abdominal tenderness.   RELEVANT LABS AND IMAGING: CBC    Component Value Date/Time   WBC 9.2 02/24/2024 0925   RBC 5.13 02/24/2024 0925   HGB 14.9  02/24/2024 0925   HCT 43.1 02/24/2024 0925   PLT 287 02/24/2024 0925   MCV 84.0 02/24/2024 0925   MCH 29.0 02/24/2024 0925   MCHC 34.6 02/24/2024 0925   RDW 12.8 02/24/2024 0925   LYMPHSABS 1.9 03/09/2023 0930   MONOABS 0.4 03/09/2023 0930   EOSABS 0.2 03/09/2023 0930   BASOSABS 0.0 03/09/2023 0930    CMP     Component Value Date/Time   NA 137 02/24/2024 0925   K 3.9 02/24/2024 0925   CL 103 02/24/2024 0925   CO2 27 02/24/2024 0925   GLUCOSE 108 (H) 02/24/2024 0925   BUN 18 02/24/2024 0925   CREATININE 1.00 02/24/2024 0925  CREATININE 1.05 02/04/2016 1105   CALCIUM 9.4 02/24/2024 0925   PROT 7.4 02/24/2024 0925   ALBUMIN 4.1 02/24/2024 0925   AST 14 (L) 02/24/2024 0925   ALT 15 02/24/2024 0925   ALKPHOS 51 02/24/2024 0925   BILITOT 0.9 02/24/2024 0925   GFRNONAA >60 02/24/2024 0925   GFRNONAA 87 02/04/2016 1105   GFRAA >89 02/04/2016 1105     Assessment/Plan:   GERD Upper abdominal pain CTA chest abdomen pelvis 02/24/2024: 2 cm stone in fundus of gallbladder, normal pancreas, normal liver, normal bile ducts, normal vasculature.  Normal CMP, CBC, TSH, lipase. Upper abdominal pain with eating fatty/greasy meals, mild GERD not completely controlled on pantoprazole  20 Mg once daily, greasy stools with eating fatty/greasy meals.  Suspect patient has symptomatic cholelithiasis especially with such significant stone in the fundus of the gallbladder causing his symptoms. - Refer to [facility] for consideration of cholecystectomy for 2 cm gallstone in the fundus of gallbladder - Increase pantoprazole  to 40 Mg once daily to help control GERD - Educated patient on lifestyle modifications and provided patient education handouts  Weight loss Patient reports fearful eating due to pain with eating and appears he has lost about 10 pounds over the past 3 months.  CTA with 2 cm gallstone, otherwise normal.  Weight loss likely secondary to decreased caloric intake.  Reassuringly up-to-date  colonoscopy. - Continue to monitor weight - Refer to [facility] for cholecystectomy - Avoid fatty/greasy foods  History of colon polyps Colonoscopy 05/2021 with tubular adenoma x 1 and hyperplastic polyps x 2 with repeat of 7 years - Due for repeat 05/2028   Suzanna Erp, PA-C [facility] 04/04/2024, 11:07 AM  Cc: Ezell Hollow, MD

## 2024-04-04 ENCOUNTER — Ambulatory Visit (INDEPENDENT_AMBULATORY_CARE_PROVIDER_SITE_OTHER): Admitting: Gastroenterology

## 2024-04-04 ENCOUNTER — Encounter: Payer: Self-pay | Admitting: Gastroenterology

## 2024-04-04 VITALS — BP 122/80 | HR 60 | Ht 72.0 in | Wt 169.0 lb

## 2024-04-04 DIAGNOSIS — K219 Gastro-esophageal reflux disease without esophagitis: Secondary | ICD-10-CM

## 2024-04-04 DIAGNOSIS — R1010 Upper abdominal pain, unspecified: Secondary | ICD-10-CM

## 2024-04-04 DIAGNOSIS — R634 Abnormal weight loss: Secondary | ICD-10-CM | POA: Diagnosis not present

## 2024-04-04 DIAGNOSIS — Z860101 Personal history of adenomatous and serrated colon polyps: Secondary | ICD-10-CM

## 2024-04-04 DIAGNOSIS — Z860100 Personal history of colon polyps, unspecified: Secondary | ICD-10-CM

## 2024-04-04 DIAGNOSIS — K8020 Calculus of gallbladder without cholecystitis without obstruction: Secondary | ICD-10-CM | POA: Diagnosis not present

## 2024-04-04 MED ORDER — PANTOPRAZOLE SODIUM 40 MG PO TBEC: 40.0000 mg | DELAYED_RELEASE_TABLET | Freq: Every day | ORAL | 2 refills | Status: DC

## 2024-04-04 NOTE — Patient Instructions (Signed)
 _______________________________________________________  If your blood pressure at your visit was 140/90 or greater, please contact your primary care physician to follow up on this.  _______________________________________________________  If you are age 51 or older, your body mass index should be between 23-30. Your Body mass index is 22.92 kg/m. If this is out of the aforementioned range listed, please consider follow up with your Primary Care Provider.  If you are age 69 or younger, your body mass index should be between 19-25. Your Body mass index is 22.92 kg/m. If this is out of the aformentioned range listed, please consider follow up with your Primary Care Provider.   ________________________________________________________  The [facility] providers would like to encourage you to use MYCHART to communicate with providers for non-urgent requests or questions.  Due to long hold times on the telephone, sending your provider a message by [facility] may be a faster and more efficient way to get a response.  Please allow 48 business hours for a response.  Please remember that this is for non-urgent requests.  _______________________________________________________  A referral will be sent to central [place] [facility]. Please call our office in 2 weeks if you haven't heard from them  We have sent the following medications to your pharmacy for you to pick up at your convenience: Protonix   It was a pleasure to see you today!  Thank you for trusting me with your gastrointestinal care!

## 2024-04-15 ENCOUNTER — Telehealth: Payer: Self-pay

## 2024-04-15 NOTE — Telephone Encounter (Signed)
 [facility] said that he was contacted June 2nd and hasn't contracted them back but they have received the referral

## 2024-04-21 NOTE — Progress Notes (Signed)
 Agree with the assessment and plan as outlined by Boone Master, PA-C.  Nimrat Woolworth, DO, [facility]

## 2024-05-23 ENCOUNTER — Ambulatory Visit: Payer: Self-pay | Admitting: General Surgery

## 2024-06-09 ENCOUNTER — Other Ambulatory Visit: Payer: Self-pay | Admitting: Gastroenterology

## 2025-01-29 ENCOUNTER — Ambulatory Visit (HOSPITAL_COMMUNITY): Admit: 2025-01-29 | Admitting: General Surgery
